# Patient Record
Sex: Male | Born: 1965 | Race: White | Hispanic: No | Marital: Married | Smoking: Never smoker
Health system: Southern US, Community
[De-identification: ages and names within clinical notes are randomized; demographics above are authoritative.]

## PROBLEM LIST (undated history)

## (undated) DIAGNOSIS — F32A Depression, unspecified: Secondary | ICD-10-CM

## (undated) DIAGNOSIS — IMO0001 Reserved for inherently not codable concepts without codable children: Secondary | ICD-10-CM

## (undated) DIAGNOSIS — K449 Diaphragmatic hernia without obstruction or gangrene: Secondary | ICD-10-CM

## (undated) DIAGNOSIS — K2970 Gastritis, unspecified, without bleeding: Secondary | ICD-10-CM

## (undated) DIAGNOSIS — M1990 Unspecified osteoarthritis, unspecified site: Secondary | ICD-10-CM

## (undated) DIAGNOSIS — M545 Low back pain: Secondary | ICD-10-CM

## (undated) DIAGNOSIS — Z872 Personal history of diseases of the skin and subcutaneous tissue: Secondary | ICD-10-CM

## (undated) DIAGNOSIS — F329 Major depressive disorder, single episode, unspecified: Secondary | ICD-10-CM

## (undated) DIAGNOSIS — G8929 Other chronic pain: Secondary | ICD-10-CM

## (undated) DIAGNOSIS — K219 Gastro-esophageal reflux disease without esophagitis: Secondary | ICD-10-CM

## (undated) HISTORY — DX: Other chronic pain: G89.29

## (undated) HISTORY — DX: Low back pain: M54.5

## (undated) HISTORY — DX: Gastritis, unspecified, without bleeding: K29.70

## (undated) HISTORY — DX: Unspecified osteoarthritis, unspecified site: M19.90

## (undated) HISTORY — DX: Diaphragmatic hernia without obstruction or gangrene: K44.9

## (undated) HISTORY — DX: Depression, unspecified: F32.A

## (undated) HISTORY — DX: Gastro-esophageal reflux disease without esophagitis: K21.9

## (undated) HISTORY — DX: Reserved for inherently not codable concepts without codable children: IMO0001

## (undated) HISTORY — DX: Personal history of diseases of the skin and subcutaneous tissue: Z87.2

## (undated) HISTORY — PX: OTHER SURGICAL HISTORY: SHX169

## (undated) HISTORY — DX: Major depressive disorder, single episode, unspecified: F32.9

---
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
## 1988-05-11 HISTORY — PX: OTHER SURGICAL HISTORY: SHX169

## 2006-05-11 DIAGNOSIS — M545 Low back pain, unspecified: Secondary | ICD-10-CM

## 2006-05-11 DIAGNOSIS — G8929 Other chronic pain: Secondary | ICD-10-CM

## 2006-05-11 HISTORY — DX: Low back pain, unspecified: M54.50

## 2006-05-11 HISTORY — DX: Other chronic pain: G89.29

## 2006-07-06 ENCOUNTER — Ambulatory Visit: Payer: Self-pay | Admitting: Specialist

## 2008-05-11 DIAGNOSIS — Z872 Personal history of diseases of the skin and subcutaneous tissue: Secondary | ICD-10-CM

## 2008-05-11 HISTORY — DX: Personal history of diseases of the skin and subcutaneous tissue: Z87.2

## 2009-08-28 LAB — HM COLONOSCOPY

## 2010-01-10 ENCOUNTER — Ambulatory Visit: Payer: Self-pay | Admitting: Gastroenterology

## 2010-01-14 LAB — PATHOLOGY REPORT

## 2011-07-17 ENCOUNTER — Encounter: Payer: Self-pay | Admitting: Internal Medicine

## 2011-08-27 ENCOUNTER — Ambulatory Visit (INDEPENDENT_AMBULATORY_CARE_PROVIDER_SITE_OTHER): Payer: BC Managed Care – PPO | Admitting: Internal Medicine

## 2011-08-27 ENCOUNTER — Encounter: Payer: Self-pay | Admitting: Internal Medicine

## 2011-08-27 VITALS — BP 116/72 | HR 52 | Temp 97.7°F | Resp 16 | Wt 234.2 lb

## 2011-08-27 DIAGNOSIS — K589 Irritable bowel syndrome without diarrhea: Secondary | ICD-10-CM

## 2011-08-27 DIAGNOSIS — R0609 Other forms of dyspnea: Secondary | ICD-10-CM

## 2011-08-27 DIAGNOSIS — Z0000 Encounter for general adult medical examination without abnormal findings: Secondary | ICD-10-CM | POA: Insufficient documentation

## 2011-08-27 DIAGNOSIS — R0989 Other specified symptoms and signs involving the circulatory and respiratory systems: Secondary | ICD-10-CM

## 2011-08-27 DIAGNOSIS — R0600 Dyspnea, unspecified: Secondary | ICD-10-CM

## 2011-08-27 NOTE — Patient Instructions (Signed)
Health Maintenance, Males A healthy lifestyle and preventative care can promote health and wellness.  Maintain regular health, dental, and eye exams.   Eat a healthy diet. Foods like vegetables, fruits, whole grains, low-fat dairy products, and lean protein foods contain the nutrients you need without too many calories. Decrease your intake of foods high in solid fats, added sugars, and salt. Get information about a proper diet from your caregiver, if necessary.   Regular physical exercise is one of the most important things you can do for your health. Most adults should get at least 150 minutes of moderate-intensity exercise (any activity that increases your heart rate and causes you to sweat) each week. In addition, most adults need muscle-strengthening exercises on 2 or more days a week.    Maintain a healthy weight. The body mass index (BMI) is a screening tool to identify possible weight problems. It provides an estimate of body fat based on height and weight. Your caregiver can help determine your BMI, and can help you achieve or maintain a healthy weight. For adults 20 years and older:   A BMI below 18.5 is considered underweight.   A BMI of 18.5 to 24.9 is normal.   A BMI of 25 to 29.9 is considered overweight.   A BMI of 30 and above is considered obese.   Maintain normal blood lipids and cholesterol by exercising and minimizing your intake of saturated fat. Eat a balanced diet with plenty of fruits and vegetables. Blood tests for lipids and cholesterol should begin at age 20 and be repeated every 5 years. If your lipid or cholesterol levels are high, you are over 50, or you are a high risk for heart disease, you may need your cholesterol levels checked more frequently.Ongoing high lipid and cholesterol levels should be treated with medicines, if diet and exercise are not effective.   If you smoke, find out from your caregiver how to quit. If you do not use tobacco, do not start.    If you choose to drink alcohol, do not exceed 2 drinks per day. One drink is considered to be 12 ounces (355 mL) of beer, 5 ounces (148 mL) of wine, or 1.5 ounces (44 mL) of liquor.   Avoid use of street drugs. Do not share needles with anyone. Ask for help if you need support or instructions about stopping the use of drugs.   High blood pressure causes heart disease and increases the risk of stroke. Blood pressure should be checked at least every 1 to 2 years. Ongoing high blood pressure should be treated with medicines if weight loss and exercise are not effective.   If you are 45 to 45 years old, ask your caregiver if you should take aspirin to prevent heart disease.   Diabetes screening involves taking a blood sample to check your fasting blood sugar level. This should be done once every 3 years, after age 45, if you are within normal weight and without risk factors for diabetes. Testing should be considered at a younger age or be carried out more frequently if you are overweight and have at least 1 risk factor for diabetes.   Colorectal cancer can be detected and often prevented. Most routine colorectal cancer screening begins at the age of 50 and continues through age 75. However, your caregiver may recommend screening at an earlier age if you have risk factors for colon cancer. On a yearly basis, your caregiver may provide home test kits to check for hidden   blood in the stool. Use of a small camera at the end of a tube, to directly examine the colon (sigmoidoscopy or colonoscopy), can detect the earliest forms of colorectal cancer. Talk to your caregiver about this at age 50, when routine screening begins. Direct examination of the colon should be repeated every 5 to 10 years through age 75, unless early forms of pre-cancerous polyps or small growths are found.   Hepatitis C blood testing is recommended for all people born from 1945 through 1965 and any individual with known risks for  hepatitis C.   Healthy men should no longer receive prostate-specific antigen (PSA) blood tests as part of routine cancer screening. Consult with your caregiver about prostate cancer screening.   Testicular cancer screening is not recommended for adolescents or adult males who have no symptoms. Screening includes self-exam, caregiver exam, and other screening tests. Consult with your caregiver about any symptoms you have or any concerns you have about testicular cancer.   Practice safe sex. Use condoms and avoid high-risk sexual practices to reduce the spread of sexually transmitted infections (STIs).   Use sunscreen with a sun protection factor (SPF) of 30 or greater. Apply sunscreen liberally and repeatedly throughout the day. You should seek shade when your shadow is shorter than you. Protect yourself by wearing long sleeves, pants, a wide-brimmed hat, and sunglasses year round, whenever you are outdoors.   Notify your caregiver of new moles or changes in moles, especially if there is a change in shape or color. Also notify your caregiver if a mole is larger than the size of a pencil eraser.   A one-time screening for abdominal aortic aneurysm (AAA) and surgical repair of large AAAs by sound wave imaging (ultrasonography) is recommended for ages 65 to 75 years who are current or former smokers.   Stay current with your immunizations.  Document Released: 10/24/2007 Document Revised: 04/16/2011 Document Reviewed: 09/22/2010 [employer] Patient Information 2012 [employer]. 

## 2011-08-27 NOTE — Assessment & Plan Note (Signed)
Exam done, labs ordered, pt ed material was given 

## 2011-08-27 NOTE — Progress Notes (Signed)
Subjective:    Patient ID: Manuel Bauer, male    DOB: August 19, 1965, 46 y.o.   MRN: [id number]  Shortness of Breath This is a chronic problem. The current episode started more than 1 year ago. The problem occurs intermittently. The problem has been unchanged. Associated symptoms include abdominal pain (chronic abd pain and bloating). Pertinent negatives include no chest pain, claudication, fever, headaches, hemoptysis, leg swelling, neck pain, orthopnea, PND, rash, rhinorrhea, sputum production, syncope, vomiting or wheezing. The symptoms are aggravated by exercise. Risk factors include no known risk factors.      Review of Systems  Constitutional: Negative for fever, chills, diaphoresis, activity change, appetite change, fatigue and unexpected weight change.  HENT: Negative.  Negative for rhinorrhea and neck pain.   Eyes: Negative.   Respiratory: Positive for shortness of breath. Negative for apnea, cough, hemoptysis, sputum production, choking, chest tightness, wheezing and stridor.   Cardiovascular: Negative for chest pain, orthopnea, claudication, leg swelling, syncope and PND.  Gastrointestinal: Positive for abdominal pain (chronic abd pain and bloating) and constipation. Negative for nausea, vomiting, diarrhea, blood in stool, abdominal distention, anal bleeding and rectal pain.  Genitourinary: Negative for dysuria, urgency, frequency, hematuria, flank pain, decreased urine volume, discharge, penile swelling, scrotal swelling, enuresis, difficulty urinating, genital sores, penile pain and testicular pain.  Musculoskeletal: Positive for back pain (chronic, unchanged LBP). Negative for myalgias, joint swelling, arthralgias and gait problem.  Skin: Negative for color change, pallor, rash and wound.  Neurological: Negative for dizziness, tremors, seizures, syncope, facial asymmetry, speech difficulty, weakness, light-headedness, numbness and headaches.  Hematological: Negative for adenopathy.  Does not bruise/bleed easily.  Psychiatric/Behavioral: Negative for suicidal ideas, hallucinations, behavioral problems, confusion, sleep disturbance, self-injury, dysphoric mood, decreased concentration and agitation. The patient is not nervous/anxious and is not hyperactive.        Objective:   Physical Exam  Vitals reviewed. Constitutional: He is oriented to person, place, and time. He appears well-developed and well-nourished. No distress.  HENT:  Head: Normocephalic and atraumatic.  Mouth/Throat: Oropharynx is clear and moist. No oropharyngeal exudate.  Eyes: Conjunctivae are normal. Right eye exhibits no discharge. Left eye exhibits no discharge. No scleral icterus.  Neck: Normal range of motion. Neck supple. No JVD present. No tracheal deviation present. No thyromegaly present.  Cardiovascular: Normal rate, regular rhythm, normal heart sounds and intact distal pulses.  Exam reveals no gallop and no friction rub.   No murmur heard. Pulmonary/Chest: Effort normal and breath sounds normal. No respiratory distress. He has no wheezes. He has no rales. He exhibits no tenderness.  Abdominal: Soft. Bowel sounds are normal. He exhibits no distension. There is no tenderness. There is no rebound and no guarding. Hernia confirmed negative in the right inguinal area and confirmed negative in the left inguinal area.  Genitourinary: Rectum normal, testes normal and penis normal. Rectal exam shows no external hemorrhoid, no internal hemorrhoid, no fissure, no mass, no tenderness and anal tone normal. Guaiac negative stool. Prostate is enlarged (1+ BPH, smooth and symmetrical). Prostate is not tender. Right testis shows no mass, no swelling and no tenderness. Right testis is descended. Cremasteric reflex is not absent on the right side. Left testis shows no mass, no swelling and no tenderness. Left testis is descended. Cremasteric reflex is not absent on the left side. Circumcised. No penile tenderness. No  discharge found.       He has solid formed stool in his rectal vault  Musculoskeletal: Normal range of motion. He exhibits no edema and  no tenderness.  Lymphadenopathy:    He has no cervical adenopathy.       Right: No inguinal adenopathy present.       Left: No inguinal adenopathy present.  Neurological: He is alert and oriented to person, place, and time. He has normal reflexes. He displays normal reflexes. No cranial nerve deficit. He exhibits normal muscle tone. Coordination normal.  Skin: Skin is warm and dry. No rash noted. He is not diaphoretic. No erythema. No pallor.  Psychiatric: His behavior is normal. Judgment and thought content normal. His mood appears anxious. His affect is not angry, not blunt, not labile and not inappropriate. His speech is not rapid and/or pressured, not delayed, not tangential and not slurred. He is not agitated, not aggressive, is not hyperactive, not slowed, not withdrawn, not actively hallucinating and not combative. Thought content is not paranoid and not delusional. Cognition and memory are not impaired. He does not express impulsivity or inappropriate judgment. He does not exhibit a depressed mood. He expresses no homicidal and no suicidal ideation. He expresses no suicidal plans and no homicidal plans. He is communicative. He exhibits normal recent memory and normal remote memory. He is attentive.          Assessment & Plan:

## 2011-08-27 NOTE — Assessment & Plan Note (Signed)
His EKG today is normal. I don't think his DOE is cardiac in nature but it is most likely aging and deconditioning

## 2011-08-30 ENCOUNTER — Encounter: Payer: Self-pay | Admitting: Internal Medicine

## 2011-08-30 DIAGNOSIS — K589 Irritable bowel syndrome without diarrhea: Secondary | ICD-10-CM | POA: Insufficient documentation

## 2011-08-30 NOTE — Assessment & Plan Note (Signed)
I await the results of his visit with GI, he was not receptive to any treatment options today such as Linzess

## 2011-08-31 ENCOUNTER — Ambulatory Visit (INDEPENDENT_AMBULATORY_CARE_PROVIDER_SITE_OTHER): Payer: BC Managed Care – PPO | Admitting: Internal Medicine

## 2011-08-31 ENCOUNTER — Encounter: Payer: Self-pay | Admitting: Internal Medicine

## 2011-08-31 DIAGNOSIS — K5900 Constipation, unspecified: Secondary | ICD-10-CM

## 2011-08-31 DIAGNOSIS — R1084 Generalized abdominal pain: Secondary | ICD-10-CM

## 2011-08-31 DIAGNOSIS — K589 Irritable bowel syndrome without diarrhea: Secondary | ICD-10-CM

## 2011-08-31 DIAGNOSIS — R142 Eructation: Secondary | ICD-10-CM

## 2011-08-31 DIAGNOSIS — R141 Gas pain: Secondary | ICD-10-CM

## 2011-08-31 DIAGNOSIS — R143 Flatulence: Secondary | ICD-10-CM

## 2011-08-31 MED ORDER — LINACLOTIDE 145 MCG PO CAPS: 1.0000 | ORAL_CAPSULE | Freq: Every day | ORAL | Status: DC

## 2011-08-31 NOTE — Patient Instructions (Addendum)
Your physician has requested that you go to the basement for the following lab work before leaving today: Celiac 10, IgA  We have given you samples of the following medication to take: Linzess 145 mcg- Take 1 capsule by mouth once daily.  If the Linzess 145 mcg 1 capsule daily makes your stool too loose, please decrease to 1 capsule every other day. If the Linzess 145 mcg 1 capsule daily does not help your constipation, please increase to 1 capsule twice daily.  We have given you an irritable bowel syndrome handout to read over.  Please call us with an update as to how your constipation is doing on Linzess. You may call phone # [phone] and ask to speak to [facility].

## 2011-08-31 NOTE — Progress Notes (Signed)
HISTORY OF PRESENT ILLNESS:  Manuel Bauer is a 46 y.o. male who presents today regarding chronic abdominal complaints of 4 years duration. He had been seen previously, on a number of occasions, and [name]. However, he was dissatisfied with his evaluation and requested to be seen in this office. His chief complaint is that of difficulties with bowel movements. Associated with this is abdominal bloating and abdominal discomfort. He states that he will not have a bowel movement, or an adequate bowel movement, unless he consumes a fiber supplement and certain coffee daily. When he does defecate, he describes stools to be somewhat loose and a large volume. He cannot go daily spontaneously. With his regimen, he can move his bowels daily. He states that defecation makes him feel great. He denies nausea, vomiting, weight loss, or bleeding. No nocturnal symptoms. Fatty foods seem to help. Activity seems to help. He cannot relate stress one way or the other. Review of outside records shows several office evaluations dating back to April 2010. The year previously was said to have a normal CT scan of the abdomen. Complete colonoscopy and upper endoscopy were performed September 2011. Colonoscopy was normal. Upper endoscopy was essentially normal except for minimal gastroduodenitis without H. Pyloric. He had been tried on MiraLax without success. He is frustrated, somewhat, without a known cause for symptoms.  REVIEW OF SYSTEMS:  All non-GI ROS negative except for back pain, visual change, night sweats, ankle swelling, shortness of breath  Past Medical History  Diagnosis Date  . Reflux   . Hiatal hernia   . Gastritis   . Arthritis   . Depression   . Chronic low back pain 2008  . H/O folliculitis 2010    Past Surgical History  Procedure Date  . Arm surgery     fracture repair - rt forearm  . Bilateral leg vein laser surgery   . Head injury 1970  . Right forearm compound fracture 1990    open  reduction and fixation    Social History Manuel Bauer  reports that he has never smoked. He has never used smokeless tobacco. He reports that he drinks about 1.8 ounces of alcohol per week. He reports that he does not use illicit drugs.  family history is not on file.  He is adopted.  No Known Allergies     PHYSICAL EXAMINATION: Vital signs: BP 130/88  Pulse 56  Ht 6\' 2"  (1.88 m)  Wt 231 lb 3.2 oz (104.872 kg)  BMI 29.68 kg/m2  SpO2 98%  Constitutional: generally well-appearing, no acute distress Psychiatric: alert and oriented x3, cooperative Eyes: extraocular movements intact, anicteric, conjunctiva pink Mouth: oral pharynx moist, no lesions Neck: supple no lymphadenopathy Cardiovascular: heart regular rate and rhythm, no murmur Lungs: clear to auscultation bilaterally Abdomen: soft, nontender, nondistended, no obvious ascites, no peritoneal signs, normal bowel sounds, no organomegaly Extremities: no lower extremity edema bilaterally Skin: no lesions on visible extremities Neuro: No focal deficits.   ASSESSMENT:  #1. Chronic bloating and difficulty with bowel movements most consistent with irritable bowel syndrome. #2. Unremarkable colonoscopy and upper endoscopy in 2011, in [place]   PLAN:  #1. Discussion of irritable bowel syndrome #2. Literature on irritable bowel syndrome provided #3. Linzess 145 mcg once daily. Samples given  #4. Patient will contact the office in a few weeks to report his response to medication. If this is helpful, he may continue. If not, consider other options and followup #5. Ongoing general medical care with Dr. Yetta Barre #6. Screening test  for celiac sprue today

## 2011-09-03 ENCOUNTER — Encounter: Payer: Self-pay | Admitting: Internal Medicine

## 2011-09-03 ENCOUNTER — Other Ambulatory Visit (INDEPENDENT_AMBULATORY_CARE_PROVIDER_SITE_OTHER): Payer: BC Managed Care – PPO

## 2011-09-03 DIAGNOSIS — Z0000 Encounter for general adult medical examination without abnormal findings: Secondary | ICD-10-CM

## 2011-09-03 DIAGNOSIS — K5900 Constipation, unspecified: Secondary | ICD-10-CM

## 2011-09-03 LAB — CBC WITH DIFFERENTIAL/PLATELET
Basophils Absolute: 0 10*3/uL (ref 0.0–0.1)
Basophils Relative: 0.8 % (ref 0.0–3.0)
Eosinophils Absolute: 0.2 10*3/uL (ref 0.0–0.7)
HCT: 44.5 % (ref 39.0–52.0)
Hemoglobin: 15.6 g/dL (ref 13.0–17.0)
Lymphocytes Relative: 38.8 % (ref 12.0–46.0)
Lymphs Abs: 1.7 10*3/uL (ref 0.7–4.0)
MCHC: 35.1 g/dL (ref 30.0–36.0)
Neutro Abs: 2 10*3/uL (ref 1.4–7.7)
RBC: 4.78 Mil/uL (ref 4.22–5.81)
RDW: 12.3 % (ref 11.5–14.6)

## 2011-09-03 LAB — COMPREHENSIVE METABOLIC PANEL
ALT: 45 U/L (ref 0–53)
AST: 29 U/L (ref 0–37)
Alkaline Phosphatase: 71 U/L (ref 39–117)
BUN: 13 mg/dL (ref 6–23)
Calcium: 9.4 mg/dL (ref 8.4–10.5)
Creatinine, Ser: 0.9 mg/dL (ref 0.4–1.5)
Total Bilirubin: 0.7 mg/dL (ref 0.3–1.2)

## 2011-09-04 LAB — CELIAC PANEL 10
Endomysial Screen: NEGATIVE
Gliadin IgA: 2.8 U/mL (ref <20)
Gliadin IgG: 4.2 U/mL (ref <20)
IgA: 129 mg/dL (ref 68–379)
Tissue Transglut Ab: 6.9 U/mL (ref <20)

## 2011-10-09 ENCOUNTER — Encounter: Payer: Self-pay | Admitting: Internal Medicine

## 2011-10-09 ENCOUNTER — Ambulatory Visit: Payer: BC Managed Care – PPO | Admitting: Internal Medicine

## 2011-10-09 ENCOUNTER — Ambulatory Visit (INDEPENDENT_AMBULATORY_CARE_PROVIDER_SITE_OTHER): Payer: BC Managed Care – PPO | Admitting: Internal Medicine

## 2011-10-09 ENCOUNTER — Ambulatory Visit (INDEPENDENT_AMBULATORY_CARE_PROVIDER_SITE_OTHER)
Admission: RE | Admit: 2011-10-09 | Discharge: 2011-10-09 | Disposition: A | Discharge status: Home / Self Care | Payer: BC Managed Care – PPO | Source: Ambulatory Visit | Attending: Internal Medicine | Admitting: Internal Medicine

## 2011-10-09 VITALS — BP 110/72 | HR 52 | Temp 98.3°F | Ht 74.0 in | Wt 233.8 lb

## 2011-10-09 DIAGNOSIS — L739 Follicular disorder, unspecified: Secondary | ICD-10-CM

## 2011-10-09 DIAGNOSIS — M79645 Pain in left finger(s): Secondary | ICD-10-CM

## 2011-10-09 DIAGNOSIS — Y9366 Activity, soccer: Secondary | ICD-10-CM

## 2011-10-09 DIAGNOSIS — M79609 Pain in unspecified limb: Secondary | ICD-10-CM

## 2011-10-09 DIAGNOSIS — K589 Irritable bowel syndrome without diarrhea: Secondary | ICD-10-CM

## 2011-10-09 DIAGNOSIS — L738 Other specified follicular disorders: Secondary | ICD-10-CM

## 2011-10-09 DIAGNOSIS — L678 Other hair color and hair shaft abnormalities: Secondary | ICD-10-CM

## 2011-10-09 MED ORDER — ERYTHROMYCIN 2 % EX SOLN: Freq: Every day | CUTANEOUS | Status: DC

## 2011-10-09 MED ORDER — LINACLOTIDE 145 MCG PO CAPS: 1.0000 | ORAL_CAPSULE | Freq: Every day | ORAL | Status: DC

## 2011-10-09 NOTE — Patient Instructions (Signed)
It was good to see you today. Test(s) ordered today. Your results will be called to you after review (48-72hours after test completion). If any changes need to be made, you will be notified at that time. Medications reviewed, no changes at this time.Refill on medication(s) as discussed today.  

## 2011-10-09 NOTE — Progress Notes (Signed)
  Subjective:    Patient ID: Manuel Bauer, male    DOB: [birth year], 46 y.o.   MRN: [id number]  HPI  complains of pain in L thumb Onset 6 days ago Precipitated by accidental injury while playing soccer Pain over thumb base/PIP, esp with flexion associated with swelling and bruising which have improved over past 6 days  also requests refills of meds for IBS and folliculitis  Past Medical History  Diagnosis Date  . Reflux   . Hiatal hernia   . Gastritis   . Arthritis   . Depression   . Chronic low back pain 2008  . H/O folliculitis 2010    Review of Systems  Musculoskeletal: Positive for joint swelling.  Skin: Positive for color change.  Neurological: Negative for weakness.       Objective:   Physical Exam  BP 110/72  Pulse 52  Temp(Src) 98.3 F (36.8 C) (Oral)  Ht 6\' 2"  (1.88 m)  Wt 233 lb 12.8 oz (106.051 kg)  BMI 30.02 kg/m2  SpO2 99% Wt Readings from Last 3 Encounters:  10/09/11 233 lb 12.8 oz (106.051 kg)  08/31/11 231 lb 3.2 oz (104.872 kg)  08/27/11 234 lb 4 oz (106.255 kg)   Constitutional:  He appears well-developed and well-nourished. No distress. Cardiovascular: Normal rate, regular rhythm and normal heart sounds.  No murmur heard. no BLE edema Pulmonary/Chest: Effort normal and breath sounds normal. No respiratory distress. no wheezes.  Musculoskeletal: L PIP swelling and pain with flexion - otherwise normal range of motion. Patient exhibits slight edema of L palm.  Psychiatric: he has a normal mood and affect. behavior is normal. Judgment and thought content normal.       Assessment & Plan:  L thumb pain/swelling - ?fx vs sprain - check xray  BS-C - improved with Liness - erx refill done  Folliculitis - hx same - refill antibiotics solution as requested

## 2011-10-20 ENCOUNTER — Encounter (HOSPITAL_COMMUNITY): Payer: Self-pay | Admitting: Emergency Medicine

## 2011-10-20 ENCOUNTER — Emergency Department (HOSPITAL_COMMUNITY)
Admission: EM | Admit: 2011-10-20 | Discharge: 2011-10-20 | Disposition: A | Discharge status: Home / Self Care | Payer: BC Managed Care – PPO | Attending: Emergency Medicine | Admitting: Emergency Medicine

## 2011-10-20 DIAGNOSIS — G8929 Other chronic pain: Secondary | ICD-10-CM | POA: Insufficient documentation

## 2011-10-20 DIAGNOSIS — K219 Gastro-esophageal reflux disease without esophagitis: Secondary | ICD-10-CM | POA: Insufficient documentation

## 2011-10-20 DIAGNOSIS — K5289 Other specified noninfective gastroenteritis and colitis: Secondary | ICD-10-CM | POA: Insufficient documentation

## 2011-10-20 DIAGNOSIS — K529 Noninfective gastroenteritis and colitis, unspecified: Secondary | ICD-10-CM

## 2011-10-20 DIAGNOSIS — R42 Dizziness and giddiness: Secondary | ICD-10-CM | POA: Insufficient documentation

## 2011-10-20 DIAGNOSIS — Z8739 Personal history of other diseases of the musculoskeletal system and connective tissue: Secondary | ICD-10-CM | POA: Insufficient documentation

## 2011-10-20 LAB — DIFFERENTIAL
Basophils Absolute: 0 K/uL (ref 0.0–0.1)
Basophils Relative: 0 % (ref 0–1)
Eosinophils Absolute: 0 K/uL (ref 0.0–0.7)
Eosinophils Relative: 1 % (ref 0–5)
Lymphocytes Relative: 19 % (ref 12–46)
Lymphs Abs: 1.4 K/uL (ref 0.7–4.0)
Monocytes Absolute: 0.4 K/uL (ref 0.1–1.0)
Monocytes Relative: 5 % (ref 3–12)
Neutro Abs: 5.4 K/uL (ref 1.7–7.7)
Neutrophils Relative %: 75 % (ref 43–77)

## 2011-10-20 LAB — BASIC METABOLIC PANEL
BUN: 15 mg/dL (ref 6–23)
Calcium: 9 mg/dL (ref 8.4–10.5)
Chloride: 104 mEq/L (ref 96–112)
Creatinine, Ser: 0.89 mg/dL (ref 0.50–1.35)
GFR calc Af Amer: >90 mL/min (ref >90)

## 2011-10-20 LAB — CBC
HCT: 40.5 % (ref 39.0–52.0)
Hemoglobin: 14.7 g/dL (ref 13.0–17.0)
MCH: 32 pg (ref 26.0–34.0)
MCHC: 36.3 g/dL — ABNORMAL HIGH (ref 30.0–36.0)
MCV: 88 fL (ref 78.0–100.0)
RDW: 11.7 % (ref 11.5–15.5)

## 2011-10-20 MED ORDER — LORAZEPAM 1 MG PO TABS: 1.0000 mg | ORAL_TABLET | Freq: Three times a day (TID) | ORAL | Status: AC | PRN

## 2011-10-20 MED ORDER — PANTOPRAZOLE SODIUM 40 MG IV SOLR
40.0000 mg | Freq: Once | INTRAVENOUS | Status: AC
  Administered 2011-10-20: 40 mg via INTRAVENOUS
  Filled 2011-10-20: qty 40

## 2011-10-20 MED ORDER — MECLIZINE HCL 25 MG PO TABS: 25.0000 mg | ORAL_TABLET | Freq: Four times a day (QID) | ORAL | Status: AC

## 2011-10-20 MED ORDER — ONDANSETRON HCL 4 MG/2ML IJ SOLN
4.0000 mg | Freq: Once | INTRAMUSCULAR | Status: AC
  Administered 2011-10-20: 4 mg via INTRAVENOUS
  Filled 2011-10-20: qty 2

## 2011-10-20 MED ORDER — ONDANSETRON HCL 8 MG PO TABS: 8.0000 mg | ORAL_TABLET | ORAL | Status: AC | PRN

## 2011-10-20 MED ORDER — ONDANSETRON 4 MG PO TBDP
8.0000 mg | ORAL_TABLET | Freq: Once | ORAL | Status: AC
  Administered 2011-10-20: 8 mg via ORAL
  Filled 2011-10-20: qty 2

## 2011-10-20 MED ORDER — SODIUM CHLORIDE 0.9 % IV BOLUS (SEPSIS)
1000.0000 mL | Freq: Once | INTRAVENOUS | Status: AC
  Administered 2011-10-20: 1000 mL via INTRAVENOUS

## 2011-10-20 NOTE — ED Notes (Signed)
Dr Adriana Simas notified of pt condition.

## 2011-10-20 NOTE — ED Notes (Signed)
Pt sts woke up this am and took a new IBS med Linzess and then laid back down got up around 0700 and was feeling dizzy upon standing or turning head; pt sts ate breakfast and then became nauseated and vomited; pt sts two episodes of watery diarrhea this am which is not uncommon and sts still feels dizzy with trouble walking; pt with no obvious neuro deficits; grip strength equal; pt sts feels leathargic

## 2011-10-20 NOTE — ED Notes (Signed)
Pt reports weakness with ambulation to restroom.

## 2011-10-20 NOTE — ED Notes (Signed)
Pt became sick with vomiting and dizziness upon preparing to go home.  Pt has vomited multiple times since sitting up.  Dr. Glendell Docker notified.  Orders received.  Pt transferred to CDU[facility]

## 2011-10-20 NOTE — ED Notes (Signed)
Pt states he "might be dehydrated due to new medication for IBS that is giving me watery diarrhea"

## 2011-10-20 NOTE — Discharge Instructions (Signed)
Increase fluids.  Followup your primary care Dr.    medications for nausea, dizziness, restlessness.  Try to get in a comfortable position.   Return if getting worse

## 2011-10-20 NOTE — ED Notes (Signed)
Pt up to restroom.

## 2011-10-20 NOTE — ED Provider Notes (Addendum)
History     CSN: [id number]  Arrival date & time 10/20/11  1013   First MD Initiated Contact with Patient 10/20/11 1039      Chief Complaint  Patient presents with  . Dizziness  . Nausea    (Consider location/radiation/quality/duration/timing/severity/associated sxs/prior treatment) HPI .... dizziness, nausea, copious diarrhea this morning. felt lightheaded and dizzy after event.  Patient has been on Linzess for approximately one month for airway bowel syndrome. Nothing makes symptoms better or worse. No abdominal pain, fever, chest pain, shortness of breath, neurological deficits. Severity is mild.  Past Medical History  Diagnosis Date  . Reflux   . Hiatal hernia   . Gastritis   . Arthritis   . Depression   . Chronic low back pain 2008  . H/O folliculitis 2010    Past Surgical History  Procedure Date  . Arm surgery     fracture repair - rt forearm  . Bilateral leg vein laser surgery   . Head injury 1970  . Right forearm compound fracture 1990    open reduction and fixation    Family History  Problem Relation Age of Onset  . Adopted: Yes    History  Substance Use Topics  . Smoking status: Never Smoker   . Smokeless tobacco: Never Used  . Alcohol Use: 1.8 oz/week    3 Cans of beer per week     1-3 drinks per month      Review of Systems  All other systems reviewed and are negative.    Allergies  Review of patient's allergies indicates no known allergies.  Home Medications   Current Outpatient Rx  Name Route Sig Dispense Refill  . LINACLOTIDE 145 MCG PO CAPS Oral Take 1 capsule by mouth every other day.      BP 134/81  Pulse 46  Temp 97.4 F (36.3 C)  Resp 12  SpO2 96%  Physical Exam  Nursing note and vitals reviewed. Constitutional: He is oriented to person, place, and time. He appears well-developed and well-nourished.  HENT:  Head: Normocephalic and atraumatic.  Eyes: Conjunctivae and EOM are normal. Pupils are equal, round, and  reactive to light.  Neck: Normal range of motion. Neck supple.  Cardiovascular: Normal rate and regular rhythm.   Pulmonary/Chest: Effort normal and breath sounds normal.  Abdominal: Soft. Bowel sounds are normal.  Musculoskeletal: Normal range of motion.  Neurological: He is alert and oriented to person, place, and time.  Skin: Skin is warm and dry.  Psychiatric: He has a normal mood and affect.    ED Course  Procedures (including critical care time)  Labs Reviewed  CBC - Abnormal; Notable for the following:    MCHC 36.3 (*)    All other components within normal limits  BASIC METABOLIC PANEL - Abnormal; Notable for the following:    Glucose, Bld 112 (*)    All other components within normal limits  DIFFERENTIAL   No results found.   1. Gastroenteritis       MDM  I suspect patient got volume depleted after his bout of diarrhea. His pulse is normally low. Will hydrate with 2 L of fluids and check electrolytes.  Recheck at 1400: Doing much better.   Discussed test results with patient and his wife   1500: Patient had episode of emesis and lightheadedness.  No neuro deficits. By mouth Zofran given. Patient and wife desire to go home. Admission offered.   Donnetta Hutching, MD 10/20/11 1421  Donnetta Hutching, MD  10/20/11 1531 

## 2011-10-22 ENCOUNTER — Telehealth: Payer: Self-pay | Admitting: Internal Medicine

## 2011-10-22 NOTE — Telephone Encounter (Signed)
Pt was seen in the ER earlier this week for vertigo and vomiting. States he took linzess 2 days in a row and was told he was dehydrated. Was given 2 liters of fluid in the ER. Pt wanted to know if he needed to follow-up in the office. Discussed with pt that he could call us if he developed problems again if he needed to be seen. Pt verbalized understanding.

## 2012-10-13 ENCOUNTER — Ambulatory Visit (INDEPENDENT_AMBULATORY_CARE_PROVIDER_SITE_OTHER)
Admission: RE | Admit: 2012-10-13 | Discharge: 2012-10-13 | Disposition: A | Discharge status: Home / Self Care | Payer: BC Managed Care – PPO | Source: Ambulatory Visit | Attending: Internal Medicine | Admitting: Internal Medicine

## 2012-10-13 ENCOUNTER — Ambulatory Visit (INDEPENDENT_AMBULATORY_CARE_PROVIDER_SITE_OTHER): Payer: BC Managed Care – PPO | Admitting: Internal Medicine

## 2012-10-13 ENCOUNTER — Encounter: Payer: Self-pay | Admitting: Internal Medicine

## 2012-10-13 ENCOUNTER — Other Ambulatory Visit (INDEPENDENT_AMBULATORY_CARE_PROVIDER_SITE_OTHER): Payer: BC Managed Care – PPO

## 2012-10-13 VITALS — BP 126/88 | HR 54 | Temp 97.8°F | Resp 16 | Ht 76.0 in | Wt 229.2 lb

## 2012-10-13 DIAGNOSIS — Z0000 Encounter for general adult medical examination without abnormal findings: Secondary | ICD-10-CM

## 2012-10-13 DIAGNOSIS — S99922A Unspecified injury of left foot, initial encounter: Secondary | ICD-10-CM

## 2012-10-13 DIAGNOSIS — S8990XA Unspecified injury of unspecified lower leg, initial encounter: Secondary | ICD-10-CM

## 2012-10-13 DIAGNOSIS — S99929A Unspecified injury of unspecified foot, initial encounter: Secondary | ICD-10-CM

## 2012-10-13 DIAGNOSIS — Z136 Encounter for screening for cardiovascular disorders: Secondary | ICD-10-CM

## 2012-10-13 DIAGNOSIS — S99919A Unspecified injury of unspecified ankle, initial encounter: Secondary | ICD-10-CM

## 2012-10-13 LAB — COMPREHENSIVE METABOLIC PANEL
BUN: 10 mg/dL (ref 6–23)
CO2: 28 mEq/L (ref 19–32)
Calcium: 9.4 mg/dL (ref 8.4–10.5)
Chloride: 105 mEq/L (ref 96–112)
Creatinine, Ser: 0.9 mg/dL (ref 0.4–1.5)
GFR: 95.56 mL/min (ref >60.00)
Glucose, Bld: 91 mg/dL (ref 70–99)
Total Bilirubin: 0.8 mg/dL (ref 0.3–1.2)

## 2012-10-13 LAB — URINALYSIS, ROUTINE W REFLEX MICROSCOPIC
Bilirubin Urine: NEGATIVE
Hgb urine dipstick: NEGATIVE
Ketones, ur: NEGATIVE
Leukocytes, UA: NEGATIVE
pH: 7 (ref 5.0–8.0)

## 2012-10-13 LAB — CBC WITH DIFFERENTIAL/PLATELET
Basophils Absolute: 0 10*3/uL (ref 0.0–0.1)
Basophils Relative: 0.9 % (ref 0.0–3.0)
Eosinophils Relative: 3.9 % (ref 0.0–5.0)
Lymphs Abs: 2.1 10*3/uL (ref 0.7–4.0)
MCHC: 34.7 g/dL (ref 30.0–36.0)
MCV: 93.9 fl (ref 78.0–100.0)
Monocytes Relative: 10.8 % (ref 3.0–12.0)
Neutro Abs: 1.9 10*3/uL (ref 1.4–7.7)
Neutrophils Relative %: 40 % — ABNORMAL LOW (ref 43.0–77.0)
Platelets: 192 10*3/uL (ref 150.0–400.0)
WBC: 4.8 10*3/uL (ref 4.5–10.5)

## 2012-10-13 LAB — LIPID PANEL
Cholesterol: 169 mg/dL (ref 0–200)
HDL: 32.5 mg/dL — ABNORMAL LOW (ref >39.00)
LDL Cholesterol: 105 mg/dL — ABNORMAL HIGH (ref 0–99)
VLDL: 31.8 mg/dL (ref 0.0–40.0)

## 2012-10-13 LAB — TSH: TSH: 1.63 u[IU]/mL (ref 0.35–5.50)

## 2012-10-13 NOTE — Assessment & Plan Note (Signed)
Exam done Vaccines were reviewed Labs ordered EKG is normal (sinus bradycardia) Pt ed material was given

## 2012-10-13 NOTE — Patient Instructions (Signed)
Foot Sprain °The muscles and cord like structures which attach muscle to bone (tendons) that surround the feet are made up of units. A foot sprain can occur at the weakest spot in any of these units. This condition is most often caused by injury to or overuse of the foot, as from playing contact sports, or aggravating a previous injury, or from poor conditioning, or obesity. °SYMPTOMS °· Pain with movement of the foot. °· Tenderness and swelling at the injury site. °· Loss of strength is present in moderate or severe sprains. °THE THREE GRADES OR SEVERITY OF FOOT SPRAIN ARE: °· Mild (Grade I): Slightly pulled muscle without tearing of muscle or tendon fibers or loss of strength. °· Moderate (Grade II): Tearing of fibers in a muscle, tendon, or at the attachment to bone, with small decrease in strength. °· Severe (Grade III): Rupture of the muscle-tendon-bone attachment, with separation of fibers. Severe sprain requires surgical repair. Often repeating (chronic) sprains are caused by overuse. Sudden (acute) sprains are caused by direct injury or over-use. °DIAGNOSIS  °Diagnosis of this condition is usually by your own observation. If problems continue, a caregiver may be required for further evaluation and treatment. X-rays may be required to make sure there are not breaks in the bones (fractures) present. Continued problems may require physical therapy for treatment. °PREVENTION °· Use strength and conditioning exercises appropriate for your sport. °· Warm up properly prior to working out. °· Use athletic shoes that are made for the sport you are participating in. °· Allow adequate time for healing. Early return to activities makes repeat injury more likely, and can lead to an unstable arthritic foot that can result in prolonged disability. Mild sprains generally heal in 3 to 10 days, with moderate and severe sprains taking 2 to 10 weeks. Your caregiver can help you determine the proper time required for  healing. °HOME CARE INSTRUCTIONS  °· Apply ice to the injury for 15-20 minutes, 3-4 times per day. Put the ice in a plastic bag and place a towel between the bag of ice and your skin. °· An elastic wrap (like an Ace bandage) may be used to keep swelling down. °· Keep foot above the level of the heart, or at least raised on a footstool, when swelling and pain are present. °· Try to avoid use other than gentle range of motion while the foot is painful. Do not resume use until instructed by your caregiver. Then begin use gradually, not increasing use to the point of pain. If pain does develop, decrease use and continue the above measures, gradually increasing activities that do not cause discomfort, until you gradually achieve normal use. °· Use crutches if and as instructed, and for the length of time instructed. °· Keep injured foot and ankle wrapped between treatments. °· Massage foot and ankle for comfort and to keep swelling down. Massage from the toes up towards the knee. °· Only take over-the-counter or prescription medicines for pain, discomfort, or fever as directed by your caregiver. °SEEK IMMEDIATE MEDICAL CARE IF:  °· Your pain and swelling increase, or pain is not controlled with medications. °· You have loss of feeling in your foot or your foot turns cold or blue. °· You develop new, unexplained symptoms, or an increase of the symptoms that brought you to your caregiver. °MAKE SURE YOU:  °· Understand these instructions. °· Will watch your condition. °· Will get help right away if you are not doing well or get worse. °Document Released:   10/17/2001 Document Revised: 07/20/2011 Document Reviewed: 12/15/2007 [facility] Patient Information 2014 [place], [place]. Health Maintenance, Males A healthy lifestyle and preventative care can promote health and wellness.  Maintain regular health, dental, and eye exams.  Eat a healthy diet. Foods like vegetables, fruits, whole grains, low-fat dairy products, and  lean protein foods contain the nutrients you need without too many calories. Decrease your intake of foods high in solid fats, added sugars, and salt. Get information about a proper diet from your caregiver, if necessary.  Regular physical exercise is one of the most important things you can do for your health. Most adults should get at least 150 minutes of moderate-intensity exercise (any activity that increases your heart rate and causes you to sweat) each week. In addition, most adults need muscle-strengthening exercises on 2 or more days a week.   Maintain a healthy weight. The body mass index (BMI) is a screening tool to identify possible weight problems. It provides an estimate of body fat based on height and weight. Your caregiver can help determine your BMI, and can help you achieve or maintain a healthy weight. For adults 20 years and older:  A BMI below 18.5 is considered underweight.  A BMI of 18.5 to 24.9 is normal.  A BMI of 25 to 29.9 is considered overweight.  A BMI of 30 and above is considered obese.  Maintain normal blood lipids and cholesterol by exercising and minimizing your intake of saturated fat. Eat a balanced diet with plenty of fruits and vegetables. Blood tests for lipids and cholesterol should begin at age 1 and be repeated every 5 years. If your lipid or cholesterol levels are high, you are over 50, or you are a high risk for heart disease, you may need your cholesterol levels checked more frequently.Ongoing high lipid and cholesterol levels should be treated with medicines, if diet and exercise are not effective.  If you smoke, find out from your caregiver how to quit. If you do not use tobacco, do not start.  If you choose to drink alcohol, do not exceed 2 drinks per day. One drink is considered to be 12 ounces (355 mL) of beer, 5 ounces (148 mL) of wine, or 1.5 ounces (44 mL) of liquor.  Avoid use of street drugs. Do not share needles with anyone. Ask for help  if you need support or instructions about stopping the use of drugs.  High blood pressure causes heart disease and increases the risk of stroke. Blood pressure should be checked at least every 1 to 2 years. Ongoing high blood pressure should be treated with medicines if weight loss and exercise are not effective.  If you are 7 to 46 years old, ask your caregiver if you should take aspirin to prevent heart disease.  Diabetes screening involves taking a blood sample to check your fasting blood sugar level. This should be done once every 3 years, after age 40, if you are within normal weight and without risk factors for diabetes. Testing should be considered at a younger age or be carried out more frequently if you are overweight and have at least 1 risk factor for diabetes.  Colorectal cancer can be detected and often prevented. Most routine colorectal cancer screening begins at the age of 71 and continues through age 47. However, your caregiver may recommend screening at an earlier age if you have risk factors for colon cancer. On a yearly basis, your caregiver may provide home test kits to check for hidden blood  in the stool. Use of a small camera at the end of a tube, to directly examine the colon (sigmoidoscopy or colonoscopy), can detect the earliest forms of colorectal cancer. Talk to your caregiver about this at age 80, when routine screening begins. Direct examination of the colon should be repeated every 5 to 10 years through age 68, unless early forms of pre-cancerous polyps or small growths are found.  Hepatitis C blood testing is recommended for all people born from [birth year] through 1964 and any individual with known risks for hepatitis C.  Healthy men should no longer receive prostate-specific antigen (PSA) blood tests as part of routine cancer screening. Consult with your caregiver about prostate cancer screening.  Testicular cancer screening is not recommended for adolescents or adult males  who have no symptoms. Screening includes self-exam, caregiver exam, and other screening tests. Consult with your caregiver about any symptoms you have or any concerns you have about testicular cancer.  Practice safe sex. Use condoms and avoid high-risk sexual practices to reduce the spread of sexually transmitted infections (STIs).  Use sunscreen with a sun protection factor (SPF) of 30 or greater. Apply sunscreen liberally and repeatedly throughout the day. You should seek shade when your shadow is shorter than you. Protect yourself by wearing long sleeves, pants, a wide-brimmed hat, and sunglasses year round, whenever you are outdoors.  Notify your caregiver of new moles or changes in moles, especially if there is a change in shape or color. Also notify your caregiver if a mole is larger than the size of a pencil eraser.  A one-time screening for abdominal aortic aneurysm (AAA) and surgical repair of large AAAs by sound wave imaging (ultrasonography) is recommended for ages 30 to 25 years who are current or former smokers.  Stay current with your immunizations. Document Released: 10/24/2007 Document Revised: 07/20/2011 Document Reviewed: 09/22/2010 [facility] Patient Information 2014 [place], [place].

## 2012-10-13 NOTE — Assessment & Plan Note (Signed)
Plain film is normal Will treat for a sprain and contusion

## 2012-10-13 NOTE — Progress Notes (Signed)
Subjective:    Patient ID: Manuel Bauer, male    DOB: [birth year], 47 y.o.   MRN: [id number]  Foot Injury  Incident onset: 4 days ago. [place] The injury mechanism was a direct blow. The pain is present in the left foot. The quality of the pain is described as aching. The pain is at a severity of 2/10. The pain is mild. The pain has been improving since onset. Pertinent negatives include no inability to bear weight, loss of motion, loss of sensation, muscle weakness, numbness or tingling. He has tried acetaminophen and NSAIDs for the symptoms. The treatment provided significant relief.      Review of Systems  Constitutional: Negative.  Negative for fever, chills, diaphoresis, activity change, appetite change, fatigue and unexpected weight change.  HENT: Negative.   Eyes: Negative.   Respiratory: Negative.  Negative for cough, chest tightness, shortness of breath, wheezing and stridor.   Cardiovascular: Negative.  Negative for chest pain, palpitations and leg swelling.  Gastrointestinal: Negative.   Endocrine: Negative.   Genitourinary: Negative.   Musculoskeletal: Positive for arthralgias (knees). Negative for myalgias, back pain, joint swelling and gait problem.  Skin: Negative.   Allergic/Immunologic: Negative.   Neurological: Negative.  Negative for tingling and numbness.  Hematological: Negative.  Negative for adenopathy. Does not bruise/bleed easily.  Psychiatric/Behavioral: Negative.        Objective:   Physical Exam  Vitals reviewed. Constitutional: He is oriented to person, place, and time. He appears well-developed and well-nourished. No distress.  HENT:  Head: Normocephalic and atraumatic.  Mouth/Throat: Oropharynx is clear and moist. No oropharyngeal exudate.  Eyes: Conjunctivae are normal. Right eye exhibits no discharge. Left eye exhibits no discharge. No scleral icterus.  Neck: Normal range of motion. Neck supple. No JVD present. No  tracheal deviation present. No thyromegaly present.  Cardiovascular: Normal rate, regular rhythm, normal heart sounds and intact distal pulses.  Exam reveals no gallop and no friction rub.   No murmur heard. Pulmonary/Chest: Effort normal and breath sounds normal. No stridor. No respiratory distress. He has no wheezes. He has no rales. He exhibits no tenderness.  Abdominal: Soft. Bowel sounds are normal. He exhibits no distension and no mass. There is no tenderness. There is no rebound and no guarding. Hernia confirmed negative in the right inguinal area and confirmed negative in the left inguinal area.  Genitourinary: Testes normal and penis normal. Right testis shows no mass, no swelling and no tenderness. Right testis is descended. Left testis shows no mass, no swelling and no tenderness. Left testis is descended. Circumcised. No penile erythema or penile tenderness. No discharge found.  Musculoskeletal: Normal range of motion. He exhibits no edema and no tenderness.       Feet:  Lymphadenopathy:    He has no cervical adenopathy.       Right: No inguinal adenopathy present.       Left: No inguinal adenopathy present.  Neurological: He is oriented to person, place, and time.  Skin: Skin is warm and dry. No rash noted. He is not diaphoretic. No erythema. No pallor.  Psychiatric: He has a normal mood and affect. His behavior is normal. Judgment and thought content normal.     Lab Results  Component Value Date   WBC 7.2 10/20/2011   HGB 14.7 10/20/2011   HCT 40.5 10/20/2011   PLT 179 10/20/2011   GLUCOSE 112* 10/20/2011   ALT 45 09/03/2011   AST 29 09/03/2011   NA  137 10/20/2011   K 4.8 10/20/2011   CL 104 10/20/2011   CREATININE 0.89 10/20/2011   BUN 15 10/20/2011   CO2 28 10/20/2011   TSH 1.13 09/03/2011   PSA 1.13 09/03/2011       Assessment & Plan:

## 2012-10-14 ENCOUNTER — Encounter: Payer: Self-pay | Admitting: Internal Medicine

## 2012-11-15 ENCOUNTER — Ambulatory Visit: Payer: BC Managed Care – PPO | Admitting: Internal Medicine

## 2013-03-16 ENCOUNTER — Other Ambulatory Visit: Payer: Self-pay

## 2013-04-12 ENCOUNTER — Ambulatory Visit (INDEPENDENT_AMBULATORY_CARE_PROVIDER_SITE_OTHER): Payer: BC Managed Care – PPO | Admitting: Internal Medicine

## 2013-04-12 ENCOUNTER — Encounter: Payer: Self-pay | Admitting: Internal Medicine

## 2013-04-12 VITALS — BP 130/88 | HR 80 | Temp 97.5°F | Resp 16 | Ht 74.0 in | Wt 232.0 lb

## 2013-04-12 DIAGNOSIS — M2669 Other specified disorders of temporomandibular joint: Secondary | ICD-10-CM

## 2013-04-12 DIAGNOSIS — J329 Chronic sinusitis, unspecified: Secondary | ICD-10-CM

## 2013-04-12 DIAGNOSIS — B9689 Other specified bacterial agents as the cause of diseases classified elsewhere: Secondary | ICD-10-CM

## 2013-04-12 DIAGNOSIS — M26629 Arthralgia of temporomandibular joint, unspecified side: Secondary | ICD-10-CM

## 2013-04-12 DIAGNOSIS — A499 Bacterial infection, unspecified: Secondary | ICD-10-CM

## 2013-04-12 MED ORDER — IBUPROFEN 600 MG PO TABS: 600.0000 mg | ORAL_TABLET | Freq: Three times a day (TID) | ORAL | Status: DC | PRN

## 2013-04-12 MED ORDER — AMOXICILLIN-POT CLAVULANATE 875-125 MG PO TABS: 1.0000 | ORAL_TABLET | Freq: Two times a day (BID) | ORAL | Status: DC

## 2013-04-12 NOTE — Patient Instructions (Signed)
Temporomandibular Problems  Temporomandibular joint (TMJ) dysfunction means there are problems with the joint between your jaw and your skull. This is a joint lined by cartilage like other joints in your body but also has a small disc in the joint which keeps the bones from rubbing on each other. These joints are like other joints and can get inflamed (sore) from arthritis and other problems. When this joint gets sore, it can cause headaches and pain in the jaw and the face. CAUSES  Usually the arthritic types of problems are caused by soreness in the joint. Soreness in the joint can also be caused by overuse. This may come from grinding your teeth. It may also come from mis-alignment in the joint. DIAGNOSIS Diagnosis of this condition can often be made by history and exam. Sometimes your caregiver may need X-rays or an MRI scan to determine the exact cause. It may be necessary to see your dentist to determine if your teeth and jaws are lined up correctly. TREATMENT  Most of the time this problem is not serious; however, sometimes it can persist (become chronic). When this happens medications that will cut down on inflammation (soreness) help. Sometimes a shot of cortisone into the joint will be helpful. If your teeth are not aligned it may help for your dentist to make a splint for your mouth that can help this problem. If no physical problems can be found, the problem may come from tension. If tension is found to be the cause, biofeedback or relaxation techniques may be helpful. HOME CARE INSTRUCTIONS   Later in the day, applications of ice packs may be helpful. Ice can be used in a plastic bag with a towel around it to prevent frostbite to skin. This may be used about every 2 hours for 20 to 30 minutes, as needed while awake, or as directed by your caregiver.  Only take over-the-counter or prescription medicines for pain, discomfort, or fever as directed by your caregiver.  If physical therapy was  prescribed, follow your caregiver's directions.  Wear mouth appliances as directed if they were given. Document Released: 01/20/2001 Document Revised: 07/20/2011 Document Reviewed: 04/29/2008 [facility] Patient Information 2014 [place], [place].

## 2013-04-12 NOTE — Progress Notes (Signed)
Pre visit review using our clinic review tool, if applicable. No additional management support is needed unless otherwise documented below in the visit note. °

## 2013-04-13 ENCOUNTER — Encounter: Payer: Self-pay | Admitting: Internal Medicine

## 2013-04-13 NOTE — Assessment & Plan Note (Signed)
He will see his dentist about this within the next month For now, I have advised him to take motrin for the pain

## 2013-04-13 NOTE — Assessment & Plan Note (Signed)
I will treat the infection with augmentin

## 2013-04-13 NOTE — Progress Notes (Signed)
Subjective:    Patient ID: Manuel Bauer, male    DOB: [birth year], 47 y.o.   MRN: [id number]  Sinusitis This is a new problem. The current episode started 1 to 4 weeks ago. The problem has been gradually worsening since onset. There has been no fever. Associated symptoms include sinus pressure and a sore throat. Pertinent negatives include no chills, congestion, coughing, diaphoresis, ear pain, headaches, hoarse voice, neck pain, shortness of breath, sneezing or swollen glands. Past treatments include oral decongestants. The treatment provided mild relief.      Review of Systems  Constitutional: Negative.  Negative for fever, chills, diaphoresis, appetite change and fatigue.  HENT: Positive for postnasal drip, sinus pressure and sore throat. Negative for congestion, ear pain, facial swelling, hearing loss, hoarse voice, mouth sores, nosebleeds, rhinorrhea, sneezing, tinnitus, trouble swallowing and voice change.        He has pain over his left face in the area of the TMJ, the pain occurs when he opens and closes his mouth. He has not taken anything for the pain.  Eyes: Negative.   Respiratory: Negative.  Negative for apnea, cough, choking, chest tightness, shortness of breath, wheezing and stridor.   Cardiovascular: Negative.  Negative for chest pain, palpitations and leg swelling.  Gastrointestinal: Negative.  Negative for nausea, vomiting, abdominal pain, diarrhea, constipation and blood in stool.  Endocrine: Negative.   Genitourinary: Negative.   Musculoskeletal: Negative.  Negative for neck pain.  Skin: Negative.   Allergic/Immunologic: Negative.   Neurological: Negative.  Negative for dizziness, tremors, facial asymmetry, weakness, light-headedness and headaches.  Hematological: Negative.  Negative for adenopathy. Does not bruise/bleed easily.  Psychiatric/Behavioral: Negative.        Objective:   Physical Exam  Vitals reviewed. Constitutional: He is oriented to person, place,  and time. He appears well-developed and well-nourished.  Non-toxic appearance. He does not have a sickly appearance. He does not appear ill. No distress.  HENT:  Head: Normocephalic and atraumatic.  Right Ear: Hearing, tympanic membrane, external ear and ear canal normal.  Left Ear: Hearing, tympanic membrane, external ear and ear canal normal.  Nose: Mucosal edema and rhinorrhea present. No nose lacerations, sinus tenderness, nasal deformity, septal deviation or nasal septal hematoma. No epistaxis.  No foreign bodies. Right sinus exhibits no maxillary sinus tenderness and no frontal sinus tenderness. Left sinus exhibits maxillary sinus tenderness. Left sinus exhibits no frontal sinus tenderness.  Mouth/Throat: Oropharynx is clear and moist. Mucous membranes are not pale, not dry and not cyanotic. No oral lesions. No trismus in the jaw. No uvula swelling. No oropharyngeal exudate, posterior oropharyngeal edema, posterior oropharyngeal erythema or tonsillar abscesses.  There is some crepitance in the left TMJ but there is FROM with no swelling.  Eyes: Conjunctivae are normal. Right eye exhibits no discharge. Left eye exhibits no discharge. No scleral icterus.  Neck: Normal range of motion. Neck supple. No JVD present. No tracheal deviation present. No thyromegaly present.  Cardiovascular: Normal rate, regular rhythm, normal heart sounds and intact distal pulses.  Exam reveals no gallop and no friction rub.   No murmur heard. Pulmonary/Chest: Effort normal and breath sounds normal. No stridor. No respiratory distress. He has no wheezes. He has no rales. He exhibits no tenderness.  Abdominal: Soft. Bowel sounds are normal. He exhibits no distension and no mass. There is no tenderness. There is no rebound and no guarding.  Musculoskeletal: Normal range of motion. He exhibits no edema and no tenderness.  Lymphadenopathy:  He has no cervical adenopathy.  Neurological: He is oriented to person, place,  and time.  Skin: Skin is warm and dry. No rash noted. He is not diaphoretic. No erythema. No pallor.          Assessment & Plan:

## 2013-05-13 ENCOUNTER — Encounter: Payer: Self-pay | Admitting: Physician Assistant

## 2013-05-13 ENCOUNTER — Ambulatory Visit (INDEPENDENT_AMBULATORY_CARE_PROVIDER_SITE_OTHER): Payer: BC Managed Care – PPO | Admitting: Physician Assistant

## 2013-05-13 VITALS — BP 118/70 | HR 59 | Temp 97.9°F | Resp 16 | Wt 231.0 lb

## 2013-05-13 DIAGNOSIS — R197 Diarrhea, unspecified: Secondary | ICD-10-CM | POA: Insufficient documentation

## 2013-05-13 NOTE — Patient Instructions (Signed)
Please follow the diet below for diarrhea.  Can continue to use the Immodium if needed for diarrhea symptoms.  Continue fiber supplement and begin the Align probiotic.  I will call you with your lab results.  If symptoms acutely worsen please proceed to the ER.  If labs abnormal, we will treat accordingly.

## 2013-05-13 NOTE — Progress Notes (Signed)
Pre visit review using our clinic review tool, if applicable. No additional management support is needed unless otherwise documented below in the visit note. °

## 2013-05-13 NOTE — Assessment & Plan Note (Signed)
Physical exam unremarkable for tenderness with slightly hyperactive bowel sounds. Will obtain CBC, BMP, stool culture, fecal white blood cells, ova and parasite and C. difficile assay. Discussed possible need for imaging. Patient wishes to have labs obtained first. Patient instructed to obtain labs at [facility], since labs not available at [facility]. Patient states he will come back to [facility] on Monday for lab work. Patient given handout on BRAT diet. Can continue Imodium if needed. Continue fiber supplement and restart probiotic. Patient instructed on alarm symptoms, and when to present to the emergency department.

## 2013-05-13 NOTE — Progress Notes (Signed)
Patient ID: Manuel Bauer, male   DOB: 11-13-1965, 48 y.o.   MRN: [id number]  Patient presents to clinic today complaining of 5 days of nonbloody diarrhea. Patient endorses several frequent, loose stools per day.  Patient states he's been taking some Imodium with slight improvement of symptoms. Patient does endorse abdominal bloating, but denies abdominal pain. Patient does have history of irritable bowel syndrome that is constipation-dominant. Patient denies recent travel or sick contact. Denies tenesmus, melena or hematochezia. Denies fever, chills, malaise, fatigue, night sweats or weight loss. Denies abnormal food or water source. States his son had 24 hours of similar symptoms that has since resolved.  Past Medical History  Diagnosis Date   Reflux    Hiatal hernia    Gastritis    Arthritis    Depression    Chronic low back pain 2008   H/O folliculitis 2010    No current outpatient prescriptions on file prior to visit.   No current facility-administered medications on file prior to visit.    No Known Allergies  Family History  Problem Relation Age of Onset   Adopted: Yes    History   Social History   Marital Status: Married    Spouse Name: N/A    Number of Children: N/A   Years of Education: N/A   Social History Main Topics   Smoking status: Never Smoker    Smokeless tobacco: Never Used   Alcohol Use: 1.8 oz/week    3 Cans of beer per week     Comment: 1-3 drinks per month   Drug Use: No   Sexual Activity: Yes   Other Topics Concern   None   Social History Narrative   Two cups of coffee a day   Review of Systems - See HPI.  All other ROS are negative.  Filed Vitals:   05/13/13 1053  BP: 118/70  Pulse: 59  Temp: 97.9 F (36.6 C)  Resp: 16   Physical Exam  Vitals reviewed. Constitutional: He is oriented to person, place, and time and well-developed, well-nourished, and in no distress.  HENT:  Head: Normocephalic and atraumatic.  Eyes:  Conjunctivae are normal. Pupils are equal, round, and reactive to light.  Neck: Neck supple.  Cardiovascular: Normal rate, regular rhythm and normal heart sounds.   Pulmonary/Chest: Effort normal and breath sounds normal.  Abdominal: Soft. He exhibits no distension and no mass. There is no tenderness. There is no rebound and no guarding.  Bowel sounds slightly hyperactive. Bowel sounds present in all 4 quadrants.  Lymphadenopathy:    He has no cervical adenopathy.  Neurological: He is alert and oriented to person, place, and time.  Skin: Skin is warm and dry. No rash noted.  Psychiatric: Affect normal.   Assessment/Plan: No problem-specific assessment & plan notes found for this encounter.

## 2013-05-14 ENCOUNTER — Emergency Department: Payer: Self-pay | Admitting: Emergency Medicine

## 2013-05-14 LAB — CBC WITH DIFFERENTIAL/PLATELET
BASOS ABS: 0.1 10*3/uL (ref 0.0–0.1)
Basophil %: 0.5 %
EOS ABS: 0.4 10*3/uL (ref 0.0–0.7)
Eosinophil %: 2.9 %
HCT: 48.9 % (ref 40.0–52.0)
HGB: 17.2 g/dL (ref 13.0–18.0)
LYMPHS PCT: 19.4 %
Lymphocyte #: 2.3 10*3/uL (ref 1.0–3.6)
MCH: 31.5 pg (ref 26.0–34.0)
MCHC: 35.2 g/dL (ref 32.0–36.0)
MCV: 89 fL (ref 80–100)
MONO ABS: 1.5 x10 3/mm — AB (ref 0.2–1.0)
Monocyte %: 12.2 %
NEUTROS ABS: 7.8 10*3/uL — AB (ref 1.4–6.5)
Neutrophil %: 65 %
Platelet: 237 10*3/uL (ref 150–440)
RBC: 5.48 10*6/uL (ref 4.40–5.90)
RDW: 12.4 % (ref 11.5–14.5)
WBC: 12 10*3/uL — AB (ref 3.8–10.6)

## 2013-05-14 LAB — COMPREHENSIVE METABOLIC PANEL
ALK PHOS: 98 U/L
ANION GAP: 3 — AB (ref 7–16)
Albumin: 4.2 g/dL (ref 3.4–5.0)
BILIRUBIN TOTAL: 0.7 mg/dL (ref 0.2–1.0)
BUN: 14 mg/dL (ref 7–18)
CHLORIDE: 104 mmol/L (ref 98–107)
CO2: 28 mmol/L (ref 21–32)
Calcium, Total: 9.1 mg/dL (ref 8.5–10.1)
Creatinine: 0.99 mg/dL (ref 0.60–1.30)
EGFR (African American): >60
EGFR (Non-African Amer.): >60
Glucose: 94 mg/dL (ref 65–99)
Osmolality: 270 (ref 275–301)
Potassium: 3.5 mmol/L (ref 3.5–5.1)
SGOT(AST): 33 U/L (ref 15–37)
SGPT (ALT): 58 U/L (ref 12–78)
Sodium: 135 mmol/L — ABNORMAL LOW (ref 136–145)
Total Protein: 8.2 g/dL (ref 6.4–8.2)

## 2013-05-14 LAB — URINALYSIS, COMPLETE
BLOOD: NEGATIVE
Bacteria: NONE SEEN
Bilirubin,UR: NEGATIVE
GLUCOSE, UR: NEGATIVE mg/dL (ref 0–75)
Ketone: NEGATIVE
LEUKOCYTE ESTERASE: NEGATIVE
Nitrite: NEGATIVE
Ph: 6 (ref 4.5–8.0)
Protein: NEGATIVE
SPECIFIC GRAVITY: 1.015 (ref 1.003–1.030)
Squamous Epithelial: NONE SEEN
WBC UR: <1 /HPF (ref 0–5)

## 2013-05-14 LAB — LIPASE, BLOOD: Lipase: 159 U/L (ref 73–393)

## 2013-05-15 LAB — WBCS, STOOL

## 2013-05-16 LAB — STOOL CULTURE

## 2013-08-09 ENCOUNTER — Ambulatory Visit (INDEPENDENT_AMBULATORY_CARE_PROVIDER_SITE_OTHER): Payer: BC Managed Care – PPO | Admitting: Internal Medicine

## 2013-08-09 ENCOUNTER — Encounter: Payer: Self-pay | Admitting: Internal Medicine

## 2013-08-09 VITALS — BP 118/78 | HR 54 | Temp 98.1°F | Resp 16 | Ht 74.0 in | Wt 233.0 lb

## 2013-08-09 DIAGNOSIS — L909 Atrophic disorder of skin, unspecified: Secondary | ICD-10-CM

## 2013-08-09 DIAGNOSIS — L919 Hypertrophic disorder of the skin, unspecified: Secondary | ICD-10-CM

## 2013-08-09 DIAGNOSIS — L918 Other hypertrophic disorders of the skin: Secondary | ICD-10-CM | POA: Insufficient documentation

## 2013-08-09 NOTE — Progress Notes (Signed)
Pre visit review using our clinic review tool, if applicable. No additional management support is needed unless otherwise documented below in the visit note. °

## 2013-08-09 NOTE — Progress Notes (Signed)
° °  Subjective:    Patient ID: Manuel Bauer, male    DOB: [birth year], 48 y.o.   MRN: [id number]  HPI Comments: He has skin tags on his neck and one on his perineum that he wants for me to look at, they do not bother him.     Review of Systems  All other systems reviewed and are negative.       Objective:   Physical Exam  Skin:  Skin noted over anterior perineum - fleshy pedunculated lesion on a stalk with normal epithelium     Lab Results  Component Value Date   WBC 4.8 10/13/2012   HGB 15.2 10/13/2012   HCT 43.7 10/13/2012   PLT 192.0 10/13/2012   GLUCOSE 91 10/13/2012   CHOL 169 10/13/2012   TRIG 159.0* 10/13/2012   HDL 32.50* 10/13/2012   LDLCALC 105* 10/13/2012   ALT 36 10/13/2012   AST 29 10/13/2012   NA 140 10/13/2012   K 4.1 10/13/2012   CL 105 10/13/2012   CREATININE 0.9 10/13/2012   BUN 10 10/13/2012   CO2 28 10/13/2012   TSH 1.63 10/13/2012   PSA 1.13 09/03/2011       Assessment & Plan:

## 2013-08-09 NOTE — Assessment & Plan Note (Signed)
I reassured him that this is normal skin that does not require any intervention He does not want for me to remove it

## 2013-08-09 NOTE — Patient Instructions (Signed)
Place skin tag removal patient instructions here.

## 2013-10-09 ENCOUNTER — Telehealth: Payer: Self-pay | Admitting: Internal Medicine

## 2013-10-09 NOTE — Telephone Encounter (Signed)
Left message for pt to call back. °

## 2013-10-11 NOTE — Telephone Encounter (Signed)
Discussed with pt that per EGD report he had a small hiatal hernia. Pt does not think that will be a problem with Scuba diving.

## 2013-10-17 ENCOUNTER — Ambulatory Visit (INDEPENDENT_AMBULATORY_CARE_PROVIDER_SITE_OTHER): Payer: BC Managed Care – PPO | Admitting: Family Medicine

## 2013-10-17 VITALS — BP 110/72 | HR 67 | Temp 97.8°F | Resp 12 | Ht 74.25 in | Wt 224.0 lb

## 2013-10-17 DIAGNOSIS — Z0000 Encounter for general adult medical examination without abnormal findings: Secondary | ICD-10-CM

## 2013-10-17 LAB — LIPID PANEL
CHOLESTEROL: 196 mg/dL (ref 0–200)
HDL: 38 mg/dL — ABNORMAL LOW (ref >39)
LDL CALC: 118 mg/dL — AB (ref 0–99)
Total CHOL/HDL Ratio: 5.2 Ratio
Triglycerides: 202 mg/dL — ABNORMAL HIGH (ref <150)
VLDL: 40 mg/dL (ref 0–40)

## 2013-10-17 LAB — COMPREHENSIVE METABOLIC PANEL
ALT: 36 U/L (ref 0–53)
AST: 27 U/L (ref 0–37)
Albumin: 4.7 g/dL (ref 3.5–5.2)
Alkaline Phosphatase: 83 U/L (ref 39–117)
BILIRUBIN TOTAL: 0.7 mg/dL (ref 0.2–1.2)
BUN: 11 mg/dL (ref 6–23)
CHLORIDE: 102 meq/L (ref 96–112)
CO2: 28 meq/L (ref 19–32)
Calcium: 9.6 mg/dL (ref 8.4–10.5)
Creat: 1.01 mg/dL (ref 0.50–1.35)
Glucose, Bld: 95 mg/dL (ref 70–99)
Potassium: 4.2 mEq/L (ref 3.5–5.3)
SODIUM: 137 meq/L (ref 135–145)
TOTAL PROTEIN: 7.5 g/dL (ref 6.0–8.3)

## 2013-10-17 LAB — POCT CBC
GRANULOCYTE PERCENT: 45.9 % (ref 37–80)
HCT, POC: 49.9 % (ref 43.5–53.7)
HEMOGLOBIN: 16.6 g/dL (ref 14.1–18.1)
Lymph, poc: 2.5 (ref 0.6–3.4)
MCH, POC: 31.9 pg — AB (ref 27–31.2)
MCHC: 33.3 g/dL (ref 31.8–35.4)
MCV: 95.9 fL (ref 80–97)
MID (cbc): 0.5 (ref 0–0.9)
MPV: 10.7 fL (ref 0–99.8)
POC GRANULOCYTE: 2.6 (ref 2–6.9)
POC LYMPH PERCENT: 45.5 %L (ref 10–50)
POC MID %: 8.6 % (ref 0–12)
Platelet Count, POC: 257 10*3/uL (ref 142–424)
RBC: 5.2 M/uL (ref 4.69–6.13)
RDW, POC: 13 %
WBC: 5.6 10*3/uL (ref 4.6–10.2)

## 2013-10-17 NOTE — Progress Notes (Signed)
Subjective:    Patient ID: Manuel Bauer, male    DOB: 1965/10/09, 48 y.o.   MRN: [id number]  HPI Manuel Linger, MD -PCP, unable to schedule physical there, last CPE last year.   Manuel Bauer is a 48 y.o. 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 He is going to [place] for a scout retreat. They are doing a scuba merit badge. The scout team is going to [place]. He starts scuba training on Thursday. He had situational depression surrounding a job situation in 2006, no recent psychiatric illness. No claustrophobia. Pt has a hiatal hernia. 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 Pt is a Clinical research associate. States his job can be stressful. Pt wears glasses; occasionally wears contacts when playing soccer. Plays soccer about three times a week. No limitations with every day activities. Thinks he had a Tdap back in 2007 when he stepped on a nail in [place]. Pt is adopted, unknown FH. Non smoker. Occasional joint and back pain, but nothing that the pt is worried about at present or limiting in activities. No chest pain with exercise. Pt is married. No risk of STI. Saw his dentist back in February and has another appointment in August. Last saw his eye doctor about two years ago. Had an EKG back in January with normal results.   No other acute concerns today.   Review of Systems 13 point review of systems per patient health survey noted.  Negative other than as indicated above.     Objective:   Physical Exam  Vitals reviewed. Constitutional: He is oriented to person, place, and time. He appears well-developed and well-nourished.  HENT:  Head: Normocephalic and atraumatic.  Right Ear: External ear normal.  Left Ear: External ear normal.  Mouth/Throat: Oropharynx is clear and moist.  Eyes: Conjunctivae and EOM are normal. Pupils are equal, round, and reactive to light.  Neck: Normal range of motion. Neck supple. No thyromegaly present.   Cardiovascular: Normal rate, regular rhythm, normal heart sounds and intact distal pulses.   Pulmonary/Chest: Effort normal and breath sounds normal. No respiratory distress. He has no wheezes.  Abdominal: Soft. He exhibits no distension. There is no tenderness. Hernia confirmed negative in the right inguinal area and confirmed negative in the left inguinal area.  Musculoskeletal: Normal range of motion. He exhibits no edema and no tenderness.  Lymphadenopathy:    He has no cervical adenopathy.  Neurological: He is alert and oriented to person, place, and time. He has normal reflexes.  Skin: Skin is warm and dry.  Psychiatric: He has a normal mood and affect. His behavior is normal.       Assessment & Plan:  Manuel Bauer is a 48 y.o. male Normal routine physical examination - Plan: POCT CBC, Comprehensive metabolic panel, Lipid panel, TSH  Age appropriate CPE, forms completed for [employer] camp and scuba training.  No hx of ear rupture/disorder, no hx of heart disease known or other concerns on hx or exam.  Routine labs above and continue care with PCP.  Anticipatory guidance given.   Patient Instructions  You should receive a call or letter about your lab results within the next week to 10 days.Keeping you healthy  Get these tests  Blood pressure- Have your blood pressure checked once a year by your healthcare provider.  Normal blood pressure is 120/80.  Weight- Have your body mass index (BMI) calculated to screen for obesity.  BMI is a  measure of body fat based on height and weight. You can also calculate your own BMI at https://www.west-esparza.com/www.nhlbisupport.com/bmi/.  Cholesterol- Have your cholesterol checked regularly starting at age 47, sooner may be necessary if you have diabetes, high blood pressure, if a family member developed heart diseases at an early age or if you smoke.   Chlamydia, HIV, and other sexual transmitted disease- Get screened each year until the age of 47 then within three months of  each new sexual partner.  Diabetes- Have your blood sugar checked regularly if you have high blood pressure, high cholesterol, a family history of diabetes or if you are overweight.  Get these vaccines  Flu shot- Every fall.  Tetanus shot- Every 10 years.  Menactra- Single dose; prevents meningitis.  Take these steps  Don't smoke- If you do smoke, ask your healthcare provider about quitting. For tips on how to quit, go to www.smokefree.gov or call 1-800-QUIT-NOW.  Be physically active- Exercise 5 days a week for at least 30 minutes.  If you are not already physically active start slow and gradually work up to 30 minutes of moderate physical activity.  Examples of moderate activity include walking briskly, mowing the yard, dancing, swimming bicycling, etc.  Eat a healthy diet- Eat a variety of healthy foods such as fruits, vegetables, low fat milk, low fat cheese, yogurt, lean meats, poultry, fish, beans, tofu, etc.  For more information on healthy eating, go to www.thenutritionsource.org  Drink alcohol in moderation- Limit alcohol intake two drinks or less a day.  Never drink and drive.  Dentist- Brush and floss teeth twice daily; visit your dentis twice a year.  Depression-Your emotional health is as important as your physical health.  If you're feeling down, losing interest in things you normally enjoy please talk with your healthcare provider.  Gun Safety- If you keep a gun in your home, keep it unloaded and with the safety lock on.  Bullets should be stored separately.  Helmet use- Always wear a helmet when riding a motorcycle, bicycle, rollerblading or skateboarding.  Safe sex- If you may be exposed to a sexually transmitted infection, use a condom  Seat belts- Seat bels can save your life; always wear one.  Smoke/Carbon Monoxide detectors- These detectors need to be installed on the appropriate level of your home.  Replace batteries at least once a year.  Skin Cancer- When  out in the sun, cover up and use sunscreen SPF 15 or higher. Violence- If anyone is threatening or hurting you, please tell your healthcare provider.

## 2013-10-17 NOTE — Patient Instructions (Addendum)
You should receive a call or letter about your lab results within the next week to 10 days.Keeping you healthy  Get these tests  Blood pressure- Have your blood pressure checked once a year by your healthcare provider.  Normal blood pressure is 120/80.  Weight- Have your body mass index (BMI) calculated to screen for obesity.  BMI is a measure of body fat based on height and weight. You can also calculate your own BMI at https://www.west-esparza.com/.  Cholesterol- Have your cholesterol checked regularly starting at age 79, sooner may be necessary if you have diabetes, high blood pressure, if a family member developed heart diseases at an early age or if you smoke.   Chlamydia, HIV, and other sexual transmitted disease- Get screened each year until the age of 72 then within three months of each new sexual partner.  Diabetes- Have your blood sugar checked regularly if you have high blood pressure, high cholesterol, a family history of diabetes or if you are overweight.  Get these vaccines  Flu shot- Every fall.  Tetanus shot- Every 10 years.  Menactra- Single dose; prevents meningitis.  Take these steps  Don't smoke- If you do smoke, ask your healthcare provider about quitting. For tips on how to quit, go to www.smokefree.gov or call 1-800-QUIT-NOW.  Be physically active- Exercise 5 days a week for at least 30 minutes.  If you are not already physically active start slow and gradually work up to 30 minutes of moderate physical activity.  Examples of moderate activity include walking briskly, mowing the yard, dancing, swimming bicycling, etc.  Eat a healthy diet- Eat a variety of healthy foods such as fruits, vegetables, low fat milk, low fat cheese, yogurt, lean meats, poultry, fish, beans, tofu, etc.  For more information on healthy eating, go to www.thenutritionsource.org  Drink alcohol in moderation- Limit alcohol intake two drinks or less a day.  Never drink and drive.  Dentist- Brush  and floss teeth twice daily; visit your dentis twice a year.  Depression-Your emotional health is as important as your physical health.  If you're feeling down, losing interest in things you normally enjoy please talk with your healthcare provider.  Gun Safety- If you keep a gun in your home, keep it unloaded and with the safety lock on.  Bullets should be stored separately.  Helmet use- Always wear a helmet when riding a motorcycle, bicycle, rollerblading or skateboarding.  Safe sex- If you may be exposed to a sexually transmitted infection, use a condom  Seat belts- Seat bels can save your life; always wear one.  Smoke/Carbon Monoxide detectors- These detectors need to be installed on the appropriate level of your home.  Replace batteries at least once a year.  Skin Cancer- When out in the sun, cover up and use sunscreen SPF 15 or higher. Violence- If anyone is threatening or hurting you, please tell your healthcare provider.

## 2013-10-18 LAB — TSH: TSH: 2.324 u[IU]/mL (ref 0.350–4.500)

## 2013-10-25 ENCOUNTER — Encounter: Payer: BC Managed Care – PPO | Admitting: Internal Medicine

## 2014-01-01 ENCOUNTER — Other Ambulatory Visit (INDEPENDENT_AMBULATORY_CARE_PROVIDER_SITE_OTHER): Payer: BC Managed Care – PPO

## 2014-01-01 ENCOUNTER — Ambulatory Visit (INDEPENDENT_AMBULATORY_CARE_PROVIDER_SITE_OTHER): Payer: BC Managed Care – PPO | Admitting: Internal Medicine

## 2014-01-01 ENCOUNTER — Encounter: Payer: Self-pay | Admitting: Internal Medicine

## 2014-01-01 ENCOUNTER — Ambulatory Visit (INDEPENDENT_AMBULATORY_CARE_PROVIDER_SITE_OTHER)
Admission: RE | Admit: 2014-01-01 | Discharge: 2014-01-01 | Disposition: A | Discharge status: Home / Self Care | Payer: BC Managed Care – PPO | Source: Ambulatory Visit | Attending: Internal Medicine | Admitting: Internal Medicine

## 2014-01-01 ENCOUNTER — Other Ambulatory Visit: Payer: Self-pay | Admitting: Internal Medicine

## 2014-01-01 VITALS — BP 120/70 | HR 57 | Temp 98.6°F | Wt 228.5 lb

## 2014-01-01 DIAGNOSIS — R109 Unspecified abdominal pain: Secondary | ICD-10-CM

## 2014-01-01 LAB — URINALYSIS
Bilirubin Urine: NEGATIVE
Ketones, ur: NEGATIVE
Leukocytes, UA: NEGATIVE
NITRITE: NEGATIVE
Specific Gravity, Urine: 1.01 (ref 1.000–1.030)
TOTAL PROTEIN, URINE-UPE24: NEGATIVE
UROBILINOGEN UA: 0.2 (ref 0.0–1.0)
Urine Glucose: NEGATIVE
pH: 6 (ref 5.0–8.0)

## 2014-01-01 MED ORDER — TRAMADOL HCL 50 MG PO TABS: ORAL_TABLET | ORAL | Status: DC

## 2014-01-01 MED ORDER — CYCLOBENZAPRINE HCL 5 MG PO TABS: ORAL_TABLET | ORAL | Status: DC

## 2014-01-01 NOTE — Progress Notes (Signed)
° °  Subjective:    Patient ID: Manuel Bauer, male    DOB: 07-21-1965, 48 y.o.   MRN: [id number]  HPI   He has had bilateral flank pain, right greater than left for 5-6 weeks. There was no injury or repetitive motion prior to onset of symptoms. He does play soccer but has had no sports related injury.  He has seen a chiropractor intermittently in the past for manipulation. He has no specific musculoskeletal diagnosis.  This pain is worse sitting and in the lateral decubitus position at night.  He has no genitourinary symptoms.  He denies any neuromuscular symptoms of significance either.    Review of Systems Fever, chills, sweats, or unexplained weight loss not present. There is no numbness, tingling, or weakness in extremities.  No loss of control of bladder or bowels. Radicular type pain absent.  Dysuria, pyuria, hematuria, frequency, nocturia or polyuria are denied.     Objective:   Physical Exam General appearance is one of good health and nourishment w/o distress.  Eyes: No conjunctival inflammation or scleral icterus is present.  Oral exam: Dental hygiene is good; lips and gums are healthy appearing.There is no oropharyngeal erythema or exudate noted.   Heart:  Normal rate and regular rhythm. S1 and S2 normal without gallop, murmur, click, rub or other extra sounds     Lungs:Chest clear to auscultation; no wheezes, rhonchi,rales ,or rubs present.No increased work of breathing.   Abdomen: bowel sounds normal, soft and non-tender without masses, organomegaly or hernias noted.  No guarding or rebound . No tenderness over the flanks to percussion  Musculoskeletal: Able to lie flat and sit up without help. Negative straight leg raising bilaterally. Gait normal  Skin:Slightly damp.  Intact without suspicious lesions or rashes ; no jaundice or tenting  Lymphatic: No lymphadenopathy is noted about the head, neck, axilla               Assessment & Plan:  #1 R > L  flank pain in  Inferior thoracic spine area See orders & AVS

## 2014-01-01 NOTE — Patient Instructions (Signed)
The best exercises for the  back include freestyle swimming, stretch aerobics, and yoga.Cybex & Nautilus machines rather than dead weights are better for the back.

## 2014-01-01 NOTE — Progress Notes (Signed)
Pre visit review using our clinic review tool, if applicable. No additional management support is needed unless otherwise documented below in the visit note. ° °

## 2014-01-24 ENCOUNTER — Encounter: Payer: Self-pay | Admitting: Internal Medicine

## 2014-01-24 ENCOUNTER — Ambulatory Visit (INDEPENDENT_AMBULATORY_CARE_PROVIDER_SITE_OTHER): Payer: BC Managed Care – PPO | Admitting: Internal Medicine

## 2014-01-24 VITALS — BP 118/82 | HR 65 | Temp 98.1°F | Resp 16 | Ht 74.5 in | Wt 227.0 lb

## 2014-01-24 DIAGNOSIS — R109 Unspecified abdominal pain: Secondary | ICD-10-CM

## 2014-01-24 DIAGNOSIS — R319 Hematuria, unspecified: Secondary | ICD-10-CM

## 2014-01-24 NOTE — Patient Instructions (Signed)
Flank Pain °Flank pain refers to pain that is located on the side of the body between the upper abdomen and the back. The pain may occur over a short period of time (acute) or may be long-term or reoccurring (chronic). It may be mild or severe. Flank pain can be caused by many things. °CAUSES  °Some of the more common causes of flank pain include: °· Muscle strains.   °· Muscle spasms.   °· A disease of your spine (vertebral disk disease).   °· A lung infection (pneumonia).   °· Fluid around your lungs (pulmonary edema).   °· A kidney infection.   °· Kidney stones.   °· A very painful skin rash caused by the chickenpox virus (shingles).   °· Gallbladder disease.   °HOME CARE INSTRUCTIONS  °Home care will depend on the cause of your pain. In general, °· Rest as directed by your caregiver. °· Drink enough fluids to keep your urine clear or pale yellow. °· Only take over-the-counter or prescription medicines as directed by your caregiver. Some medicines may help relieve the pain. °· Tell your caregiver about any changes in your pain. °· Follow up with your caregiver as directed. °SEEK IMMEDIATE MEDICAL CARE IF:  °· Your pain is not controlled with medicine.   °· You have new or worsening symptoms. °· Your pain increases.   °· You have abdominal pain.   °· You have shortness of breath.   °· You have persistent nausea or vomiting.   °· You have swelling in your abdomen.   °· You feel faint or pass out.   °· You have blood in your urine. °· You have a fever or persistent symptoms for more than 2-3 days. °· You have a fever and your symptoms suddenly get worse. °MAKE SURE YOU:  °· Understand these instructions. °· Will watch your condition. °· Will get help right away if you are not doing well or get worse. °Document Released: 06/18/2005 Document Revised: 01/20/2012 Document Reviewed: 12/10/2011 °ExitCare® Patient Information ©2015 [employer]. This information is not intended to replace advice given to you by your  health care provider. Make sure you discuss any questions you have with your health care provider. ° °

## 2014-01-24 NOTE — Assessment & Plan Note (Signed)
I will check his renal function and CBC to look for organic illness and will get a CT scan done to look for mass, stones, etc

## 2014-01-24 NOTE — Assessment & Plan Note (Signed)
I will check his renal function and have ordered a CT to look for mass, stones, etc

## 2014-01-24 NOTE — Progress Notes (Signed)
Subjective:    Patient ID: Manuel Bauer, male    DOB: [birth year], 48 y.o.   MRN: [id number]  Flank Pain This is a recurrent problem. The current episode started 1 to 4 weeks ago. The problem occurs intermittently. The problem is unchanged. Pain location: bilateral lower flanks. The quality of the pain is described as aching. The pain does not radiate. The pain is at a severity of 2/10. The pain is mild. The pain is worse during the day. Pertinent negatives include no abdominal pain, bladder incontinence, bowel incontinence, chest pain, dysuria, fever, headaches, leg pain, numbness, paresis, paresthesias, pelvic pain, perianal numbness, tingling, weakness or weight loss. He has tried NSAIDs for the symptoms. The treatment provided moderate relief.      Review of Systems  Constitutional: Negative.  Negative for fever, chills, weight loss, diaphoresis, appetite change and fatigue.  HENT: Negative.   Eyes: Negative.   Respiratory: Negative.  Negative for cough, choking, chest tightness, shortness of breath and stridor.   Cardiovascular: Negative.  Negative for chest pain, palpitations and leg swelling.  Gastrointestinal: Negative.  Negative for nausea, vomiting, abdominal pain, diarrhea, constipation, blood in stool, abdominal distention, anal bleeding, rectal pain and bowel incontinence.  Endocrine: Negative.   Genitourinary: Positive for flank pain. Negative for bladder incontinence, dysuria, urgency, frequency, hematuria, decreased urine volume, discharge, penile swelling, scrotal swelling, enuresis, difficulty urinating, genital sores, penile pain, testicular pain and pelvic pain.  Musculoskeletal: Negative.  Negative for arthralgias, back pain, myalgias and neck pain.  Skin: Negative.  Negative for rash.  Allergic/Immunologic: Negative.   Neurological: Negative.  Negative for dizziness, tingling, weakness, numbness, headaches and paresthesias.  Hematological: Negative.  Negative for  adenopathy. Does not bruise/bleed easily.  Psychiatric/Behavioral: Negative.        Objective:   Physical Exam  Vitals reviewed. Constitutional: He is oriented to person, place, and time. He appears well-developed and well-nourished. No distress.  HENT:  Head: Normocephalic and atraumatic.  Mouth/Throat: Oropharynx is clear and moist. No oropharyngeal exudate.  Eyes: Conjunctivae are normal. Right eye exhibits no discharge. Left eye exhibits no discharge. No scleral icterus.  Neck: Normal range of motion. Neck supple. No JVD present. No tracheal deviation present. No thyromegaly present.  Cardiovascular: Normal rate, regular rhythm, normal heart sounds and intact distal pulses.  Exam reveals no gallop and no friction rub.   No murmur heard. Pulmonary/Chest: Effort normal and breath sounds normal. No stridor. No respiratory distress. He has no wheezes. He has no rales. He exhibits no tenderness.  Abdominal: Soft. Bowel sounds are normal. He exhibits no distension and no mass. There is no tenderness. There is no rebound and no guarding.  Musculoskeletal: Normal range of motion. He exhibits no edema and no tenderness.  Lymphadenopathy:    He has no cervical adenopathy.  Neurological: He is oriented to person, place, and time.  Skin: Skin is warm and dry. No rash noted. He is not diaphoretic. No erythema. No pallor.     Lab Results  Component Value Date   WBC 5.6 10/17/2013   HGB 16.6 10/17/2013   HCT 49.9 10/17/2013   PLT 192.0 10/13/2012   GLUCOSE 95 10/17/2013   CHOL 196 10/17/2013   TRIG 202* 10/17/2013   HDL 38* 10/17/2013   LDLCALC 118* 10/17/2013   ALT 36 10/17/2013   AST 27 10/17/2013   NA 137 10/17/2013   K 4.2 10/17/2013   CL 102 10/17/2013   CREATININE 1.01 10/17/2013   BUN 11 10/17/2013  CO2 28 10/17/2013   TSH 2.324 10/17/2013   PSA 1.13 09/03/2011       Assessment & Plan:

## 2014-01-25 ENCOUNTER — Encounter: Payer: Self-pay | Admitting: Internal Medicine

## 2014-01-25 ENCOUNTER — Other Ambulatory Visit (INDEPENDENT_AMBULATORY_CARE_PROVIDER_SITE_OTHER): Payer: BC Managed Care – PPO

## 2014-01-25 DIAGNOSIS — R319 Hematuria, unspecified: Secondary | ICD-10-CM

## 2014-01-25 DIAGNOSIS — R109 Unspecified abdominal pain: Secondary | ICD-10-CM

## 2014-01-25 LAB — CBC WITH DIFFERENTIAL/PLATELET
Basophils Absolute: 0 10*3/uL (ref 0.0–0.1)
Basophils Relative: 0.6 % (ref 0.0–3.0)
EOS PCT: 3.3 % (ref 0.0–5.0)
Eosinophils Absolute: 0.2 10*3/uL (ref 0.0–0.7)
HCT: 42.4 % (ref 39.0–52.0)
Hemoglobin: 14.7 g/dL (ref 13.0–17.0)
LYMPHS PCT: 42.4 % (ref 12.0–46.0)
Lymphs Abs: 2.3 10*3/uL (ref 0.7–4.0)
MCHC: 34.7 g/dL (ref 30.0–36.0)
MCV: 92.3 fl (ref 78.0–100.0)
MONOS PCT: 11.4 % (ref 3.0–12.0)
Monocytes Absolute: 0.6 10*3/uL (ref 0.1–1.0)
NEUTROS PCT: 42.3 % — AB (ref 43.0–77.0)
Neutro Abs: 2.3 10*3/uL (ref 1.4–7.7)
PLATELETS: 208 10*3/uL (ref 150.0–400.0)
RBC: 4.59 Mil/uL (ref 4.22–5.81)
RDW: 12 % (ref 11.5–15.5)
WBC: 5.3 10*3/uL (ref 4.0–10.5)

## 2014-01-25 LAB — COMPREHENSIVE METABOLIC PANEL
ALBUMIN: 4.2 g/dL (ref 3.5–5.2)
ALT: 36 U/L (ref 0–53)
AST: 40 U/L — ABNORMAL HIGH (ref 0–37)
Alkaline Phosphatase: 70 U/L (ref 39–117)
BUN: 18 mg/dL (ref 6–23)
CO2: 28 meq/L (ref 19–32)
Calcium: 9.3 mg/dL (ref 8.4–10.5)
Chloride: 103 mEq/L (ref 96–112)
Creatinine, Ser: 1.1 mg/dL (ref 0.4–1.5)
GFR: 77.98 mL/min (ref >60.00)
GLUCOSE: 97 mg/dL (ref 70–99)
POTASSIUM: 4.1 meq/L (ref 3.5–5.1)
SODIUM: 139 meq/L (ref 135–145)
TOTAL PROTEIN: 7.3 g/dL (ref 6.0–8.3)
Total Bilirubin: 0.8 mg/dL (ref 0.2–1.2)

## 2014-02-09 ENCOUNTER — Ambulatory Visit (INDEPENDENT_AMBULATORY_CARE_PROVIDER_SITE_OTHER)
Admission: RE | Admit: 2014-02-09 | Discharge: 2014-02-09 | Disposition: A | Discharge status: Home / Self Care | Payer: BC Managed Care – PPO | Source: Ambulatory Visit | Attending: Internal Medicine | Admitting: Internal Medicine

## 2014-02-09 DIAGNOSIS — R319 Hematuria, unspecified: Secondary | ICD-10-CM

## 2014-02-09 DIAGNOSIS — R109 Unspecified abdominal pain: Secondary | ICD-10-CM

## 2014-02-09 MED ORDER — IOHEXOL 350 MG/ML SOLN
100.0000 mL | Freq: Once | INTRAVENOUS | Status: AC | PRN
  Administered 2014-02-09: 100 mL via INTRAVENOUS

## 2014-02-11 ENCOUNTER — Encounter: Payer: Self-pay | Admitting: Internal Medicine

## 2014-07-12 ENCOUNTER — Ambulatory Visit (INDEPENDENT_AMBULATORY_CARE_PROVIDER_SITE_OTHER): Payer: BLUE CROSS/BLUE SHIELD | Admitting: Internal Medicine

## 2014-07-12 ENCOUNTER — Encounter: Payer: Self-pay | Admitting: Internal Medicine

## 2014-07-12 VITALS — BP 126/74 | HR 60 | Temp 97.9°F | Resp 16 | Ht 74.5 in | Wt 224.0 lb

## 2014-07-12 DIAGNOSIS — L723 Sebaceous cyst: Secondary | ICD-10-CM | POA: Insufficient documentation

## 2014-07-12 NOTE — Patient Instructions (Signed)
Wound Care Wound care helps prevent pain and infection.  You may need a tetanus shot if:  You cannot remember when you had your last tetanus shot.  You have never had a tetanus shot.  The injury broke your skin. If you need a tetanus shot and you choose not to have one, you may get tetanus. Sickness from tetanus can be serious. HOME CARE   Only take medicine as told by your doctor.  Clean the wound daily with mild soap and water.  Change any bandages (dressings) as told by your doctor.  Put medicated cream and a bandage on the wound as told by your doctor.  Change the bandage if it gets wet, dirty, or starts to smell.  Take showers. Do not take baths, swim, or do anything that puts your wound under water.  Rest and raise (elevate) the wound until the pain and puffiness (swelling) are better.  Keep all doctor visits as told. GET HELP RIGHT AWAY IF:   Yellowish-white fluid (pus) comes from the wound.  Medicine does not lessen your pain.  There is a red streak going away from the wound.  You have a fever. MAKE SURE YOU:   Understand these instructions.  Will watch your condition.  Will get help right away if you are not doing well or get worse. Document Released: 02/04/2008 Document Revised: 07/20/2011 Document Reviewed: 08/31/2010 [facility] Patient Information 2015 [place], [place]. This information is not intended to replace advice given to you by your health care provider. Make sure you discuss any questions you have with your health care provider.

## 2014-07-12 NOTE — Assessment & Plan Note (Signed)
I and D was successful He was given pt ed regarding wound care He will RTC in 7-10 days for suture removal and wound check

## 2014-07-12 NOTE — Progress Notes (Signed)
Pre visit review using our clinic review tool, if applicable. No additional management support is needed unless otherwise documented below in the visit note. ° °

## 2014-07-12 NOTE — Progress Notes (Signed)
° °  Subjective:    Patient ID: Manuel Bauer, male    DOB: 19-May-1965, 49 y.o.   MRN: [id number]  HPI  He is concerned about a cyst/mass over his right mid-back that he feels has grown over the last month.   Review of Systems  Constitutional: Negative.  Negative for fever, chills, diaphoresis, appetite change and fatigue.  HENT: Negative.   Eyes: Negative.   Respiratory: Negative.   Cardiovascular: Negative for chest pain, palpitations and leg swelling.  Gastrointestinal: Negative.   Endocrine: Negative.   Genitourinary: Negative.   Musculoskeletal: Negative.   Allergic/Immunologic: Negative.   Neurological: Negative.   Hematological: Negative.   Psychiatric/Behavioral: Negative.        Objective:   Physical Exam  Skin:     The area was cleaned with betadine then prepped and draped in sterile fashion, local anesthesia was obtained with 2% lido with epi, 1.5 cc were used. Next a 6 mm punch incision was made and the contents of a sebaceous cyst was removed, there was no exudate/mass/FB. The cavity was cleaned with forceps, the wound was closed with 1 simple interrupted suture using 3-0 nylon. There was no blood loss or complications. He tolerated the procedure well with no complications.          Assessment & Plan:

## 2014-07-19 ENCOUNTER — Encounter: Payer: Self-pay | Admitting: Internal Medicine

## 2014-07-19 ENCOUNTER — Ambulatory Visit (INDEPENDENT_AMBULATORY_CARE_PROVIDER_SITE_OTHER): Payer: Self-pay | Admitting: Internal Medicine

## 2014-07-19 VITALS — BP 116/78 | HR 62 | Temp 97.9°F | Resp 16 | Ht 74.5 in | Wt 222.0 lb

## 2014-07-19 DIAGNOSIS — L723 Sebaceous cyst: Secondary | ICD-10-CM

## 2014-07-19 NOTE — Patient Instructions (Signed)
Wound Care Wound care helps prevent pain and infection.  You may need a tetanus shot if:  You cannot remember when you had your last tetanus shot.  You have never had a tetanus shot.  The injury broke your skin. If you need a tetanus shot and you choose not to have one, you may get tetanus. Sickness from tetanus can be serious. HOME CARE   Only take medicine as told by your doctor.  Clean the wound daily with mild soap and water.  Change any bandages (dressings) as told by your doctor.  Put medicated cream and a bandage on the wound as told by your doctor.  Change the bandage if it gets wet, dirty, or starts to smell.  Take showers. Do not take baths, swim, or do anything that puts your wound under water.  Rest and raise (elevate) the wound until the pain and puffiness (swelling) are better.  Keep all doctor visits as told. GET HELP RIGHT AWAY IF:   Yellowish-white fluid (pus) comes from the wound.  Medicine does not lessen your pain.  There is a red streak going away from the wound.  You have a fever. MAKE SURE YOU:   Understand these instructions.  Will watch your condition.  Will get help right away if you are not doing well or get worse. Document Released: 02/04/2008 Document Revised: 07/20/2011 Document Reviewed: 08/31/2010 [facility]Exit[facility] Patient Information 2015 [place]Care, [place]. This information is not intended to replace advice given to you by your health care provider. Make sure you discuss any questions you have with your health care provider.

## 2014-07-19 NOTE — Progress Notes (Signed)
Pre visit review using our clinic review tool, if applicable. No additional management support is needed unless otherwise documented below in the visit note. ° °

## 2014-07-19 NOTE — Assessment & Plan Note (Signed)
Suture removed The wound will need more time to heal but there is no evidence of infection at this time He was given pt ed material about would care

## 2014-07-19 NOTE — Progress Notes (Signed)
° °  Subjective:    Patient ID: Manuel Bauer, male    DOB: Dec 07, 1965, 49 y.o.   MRN: [id number]  Wound Check He was originally treated 5 to 10 days ago. Previous treatment included laceration repair. His temperature was unmeasured prior to arrival. There has been no drainage from the wound. There is no redness present. There is no swelling present. The pain has no pain.      Review of Systems  All other systems reviewed and are negative.      Objective:   Physical Exam  Pulmonary/Chest:            Assessment & Plan:

## 2015-06-04 IMAGING — CR DG THORACIC SPINE 3V
3 series · 3 of 3 positions shown · non-contrast
Comparison: None.

CLINICAL DATA: Six week history of mid back pain.  No known injury.

EXAM:
THORACIC SPINE - 2 VIEW + SWIMMERS

[view not recorded (1 of 3)]
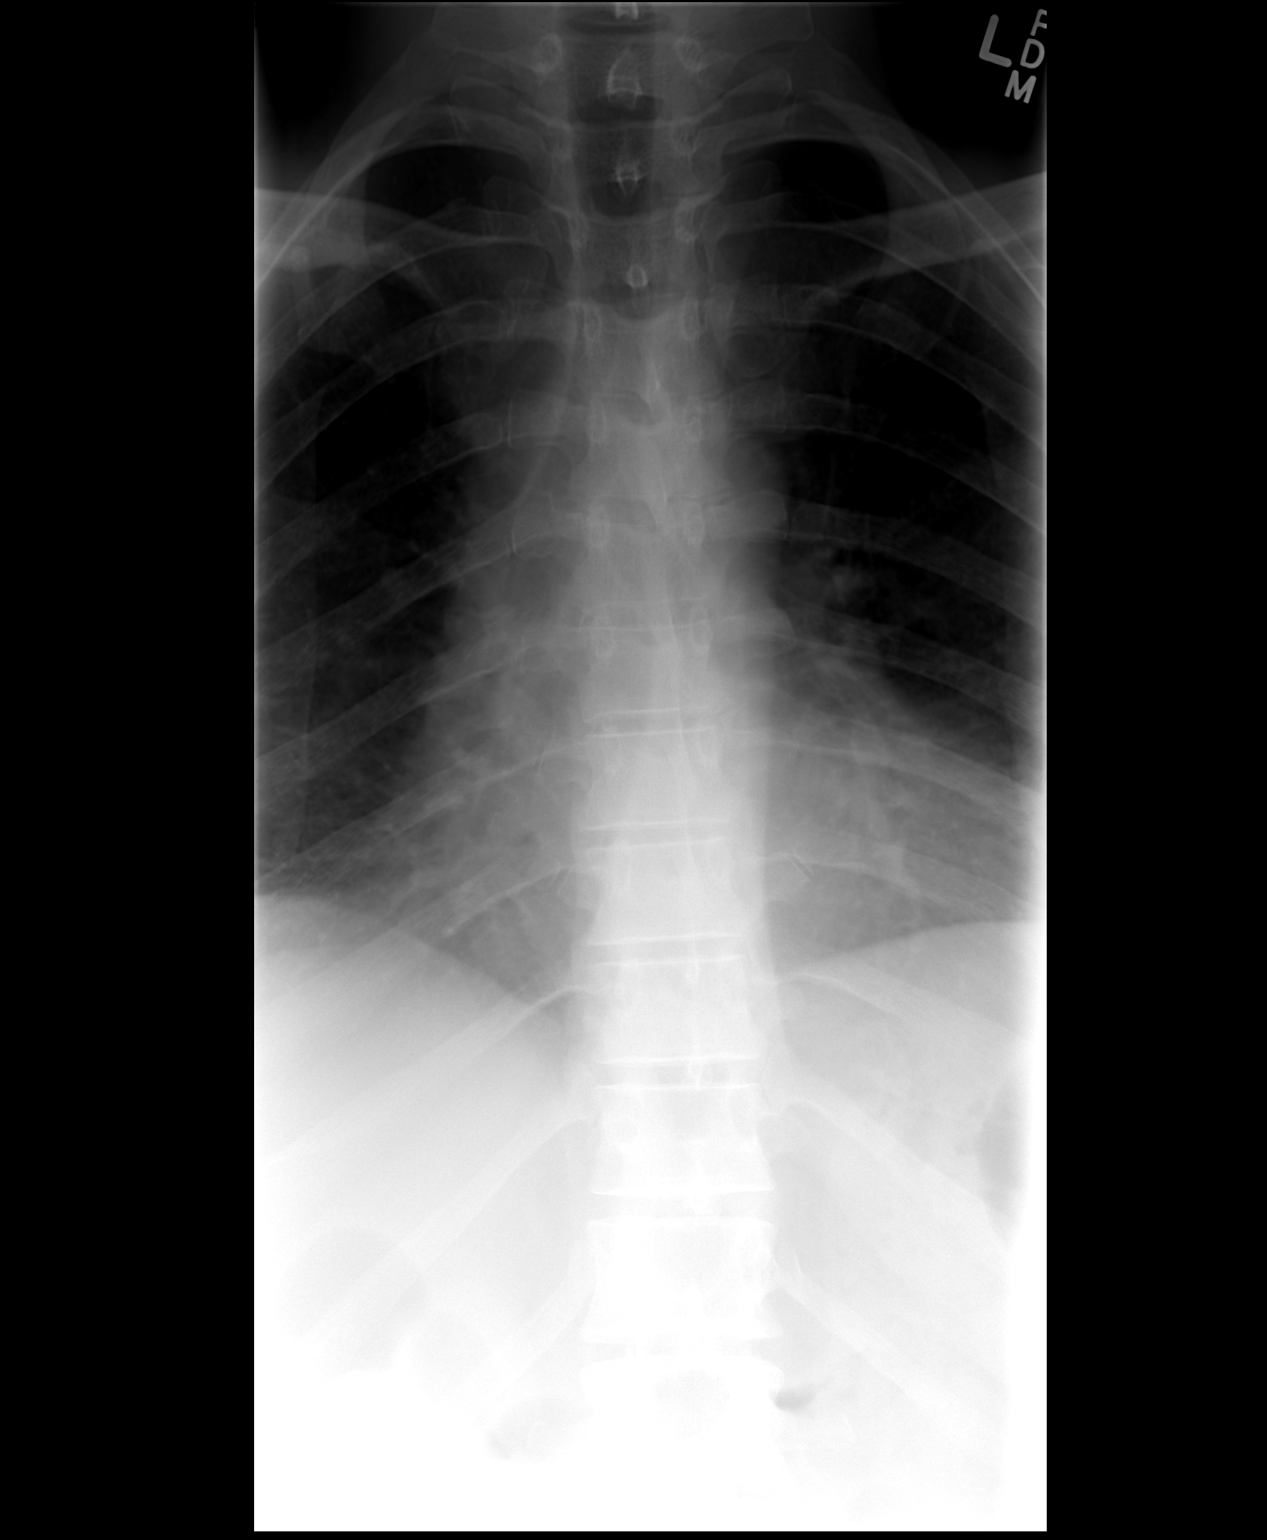

[view not recorded (2 of 3)]
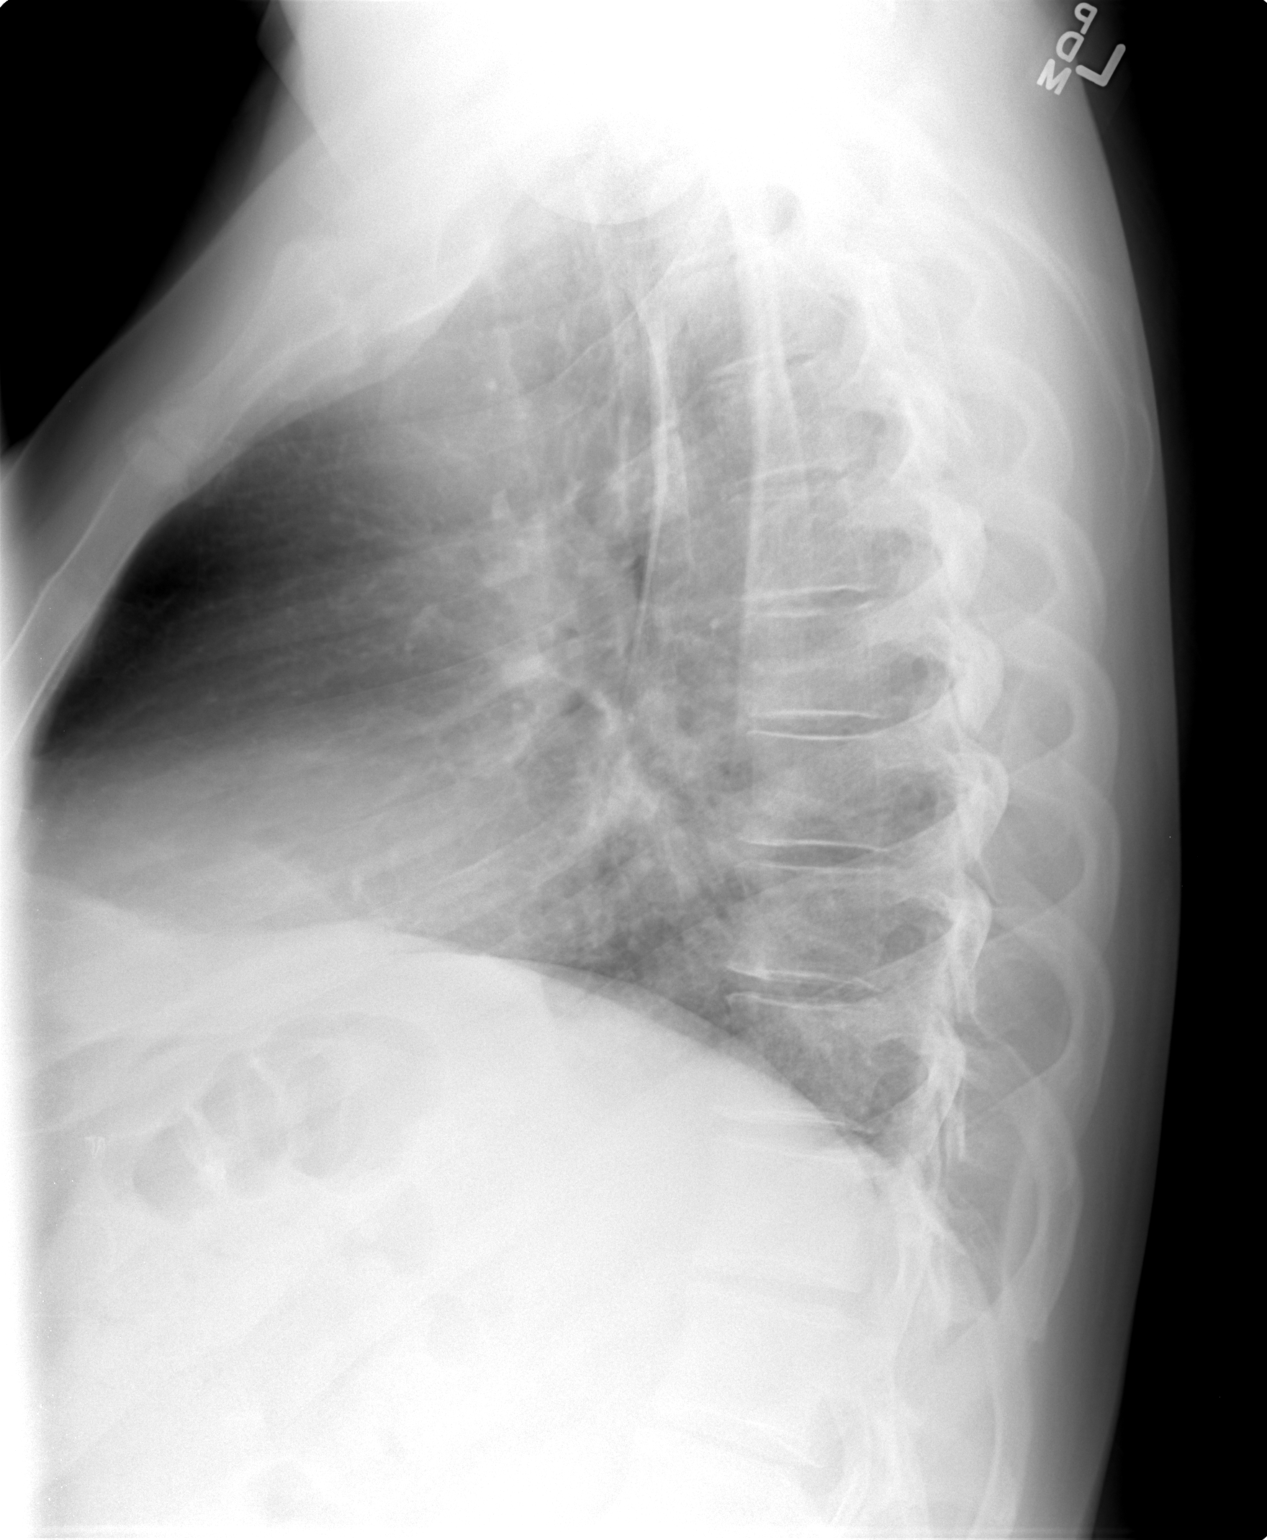

[view not recorded (3 of 3)]
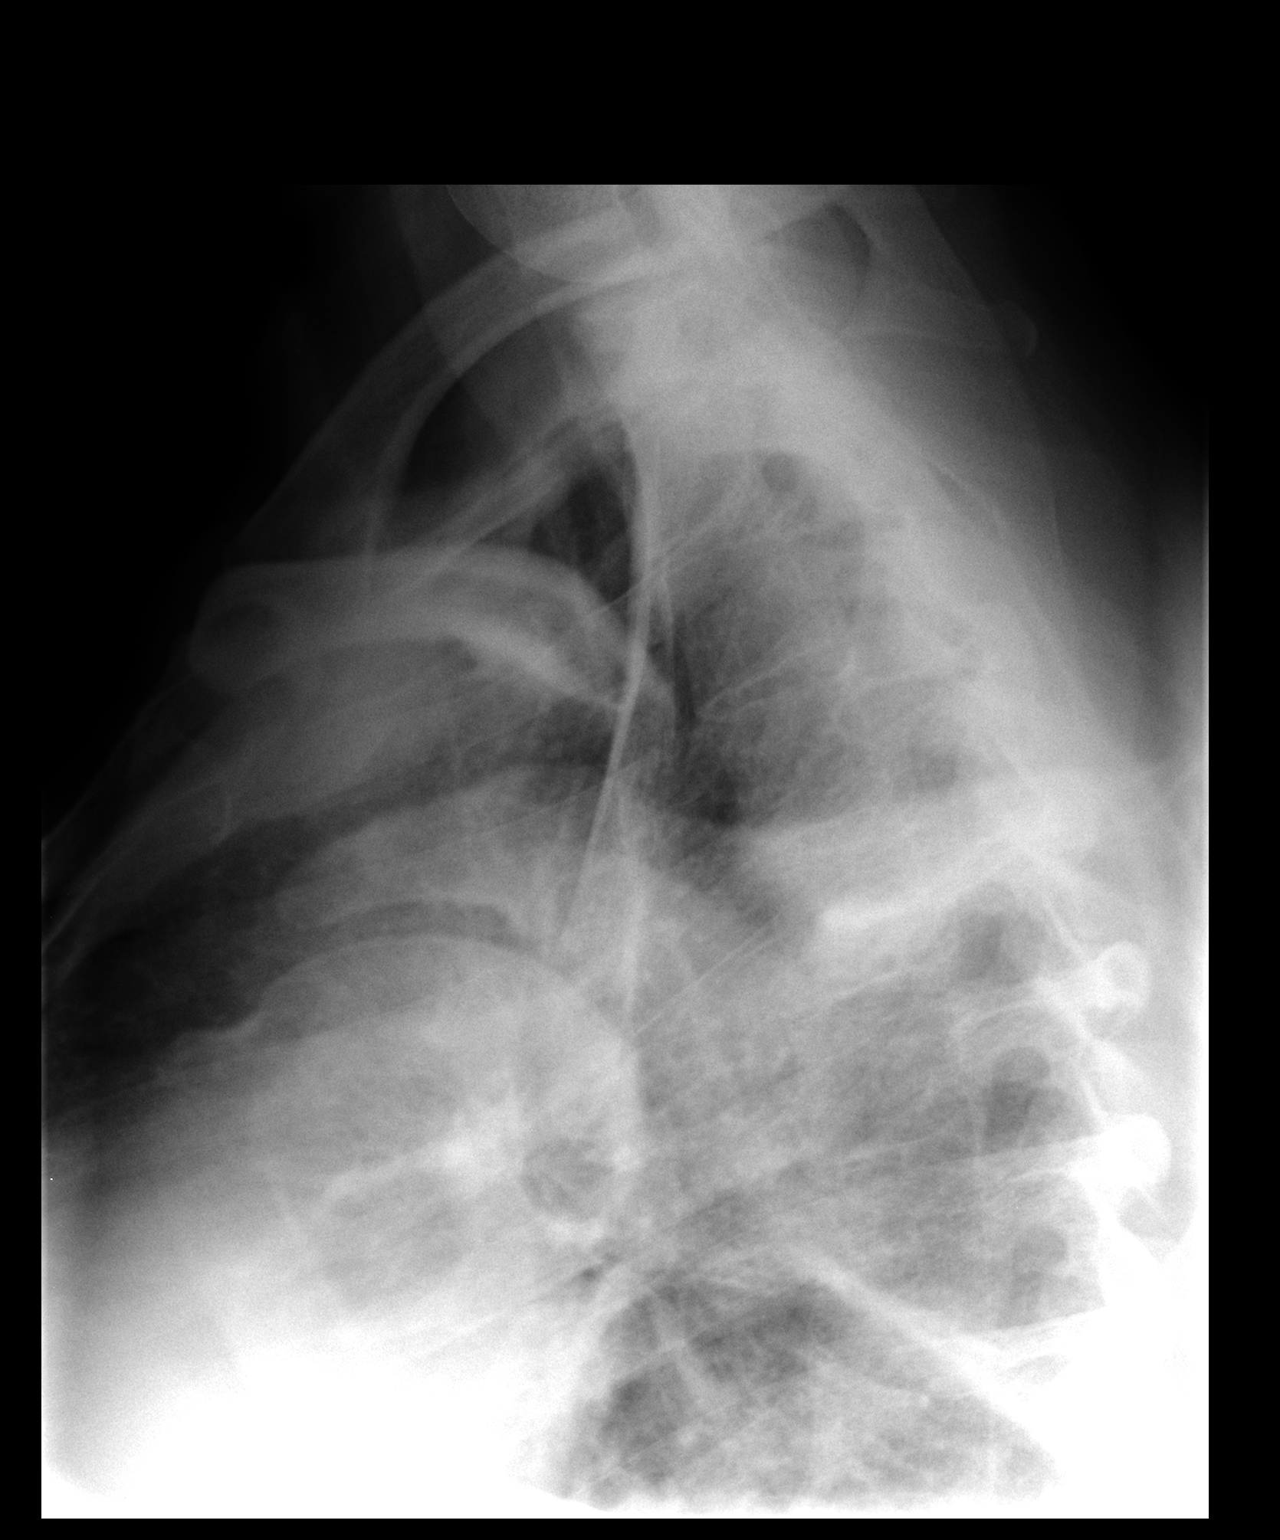

[3 of 3 positions shown; findings below may reference images not displayed]

FINDINGS: Twelve rib-bearing thoracic vertebrae with anatomic alignment. No
fractures. Well-preserved disk spaces. No significant spondylosis.
Intact pedicles.
IMPRESSION: Normal examination.

## 2015-07-13 IMAGING — CT CT ABD-PEL WO/W CM
2 of 10 series · 12 of 46 positions shown, 18 images · IV contrast (omnipaque)
Comparison: None.

CLINICAL DATA: Initial encounter for hematuria.

EXAM:
CT ABDOMEN AND PELVIS WITHOUT AND WITH CONTRAST
TECHNIQUE: Multidetector CT imaging of the abdomen and pelvis was performed
following the standard protocol before and following the bolus
administration of intravenous contrast.
CONTRAST:  100mL OMNIPAQUE IOHEXOL 350 MG/ML SOLN

[Series 4: renal nephrographic · axial · 0.76mm/px · z∈[-514,-94]mm · 10 of 169 slices shown, 15 images]
[im 15/169  soft-tissue]
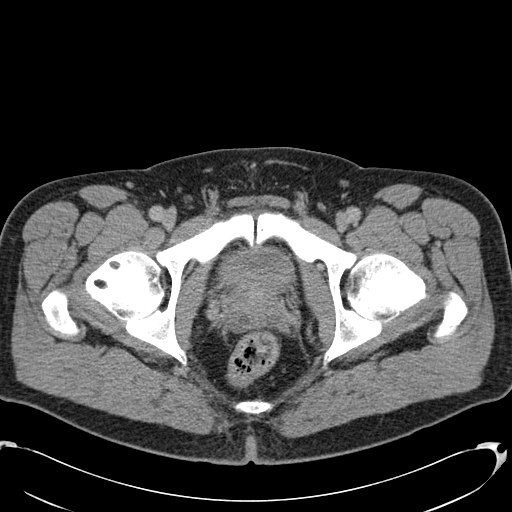
[im 15/169  bone]
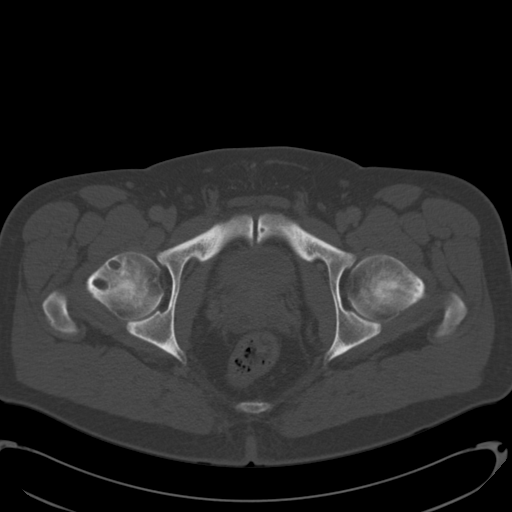
[im 29/169  soft-tissue]
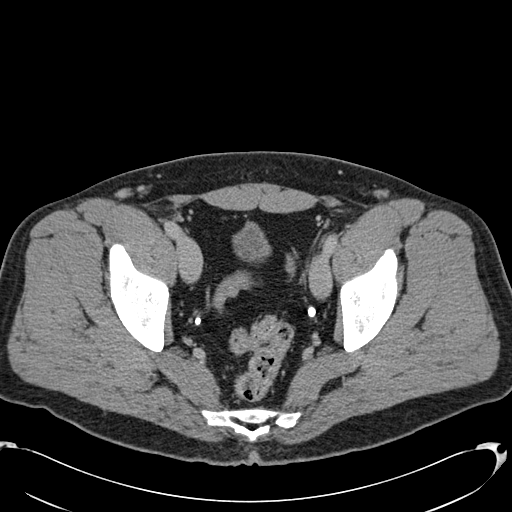
[im 57/169  soft-tissue]
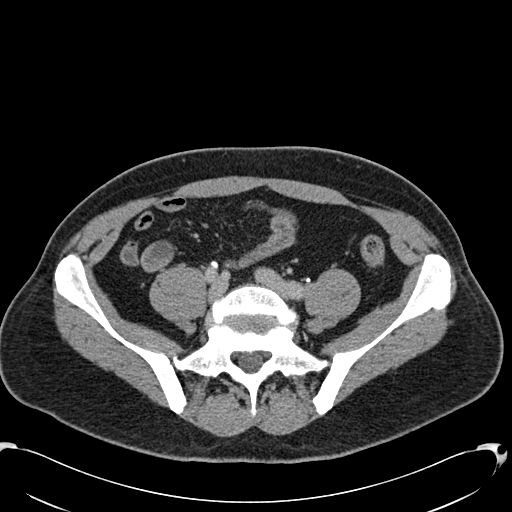
[im 71/169  soft-tissue]
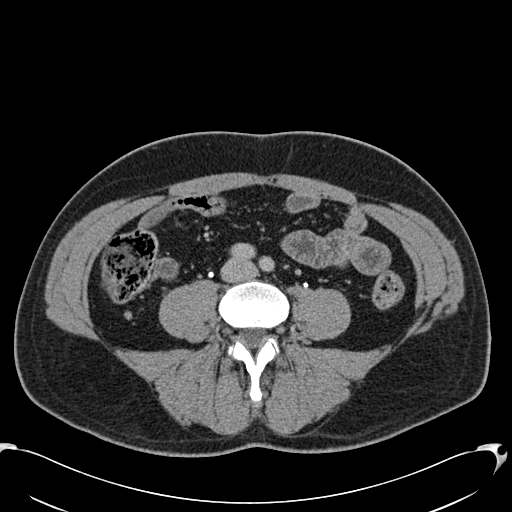
[im 85/169  soft-tissue]
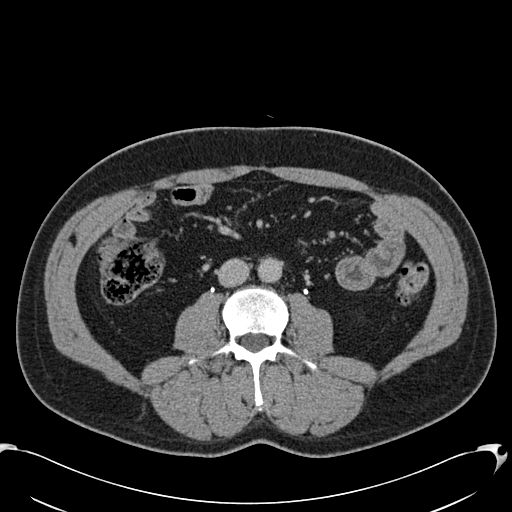
[im 99/169  soft-tissue]
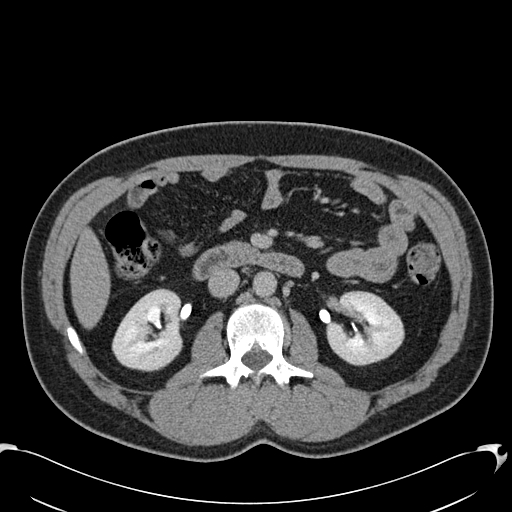
[im 113/169  soft-tissue]
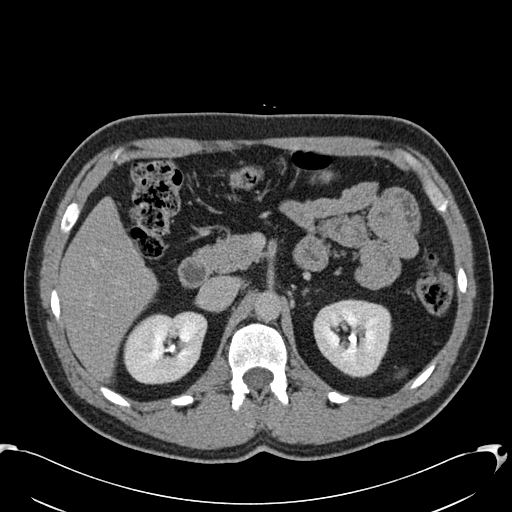
[im 113/169  lung]
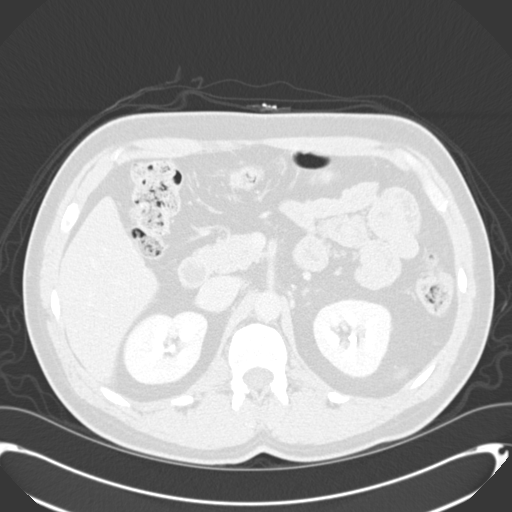
[im 127/169  lung]
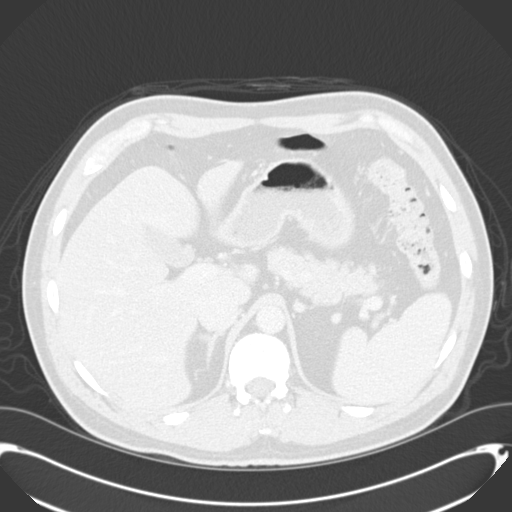
[im 141/169  soft-tissue]
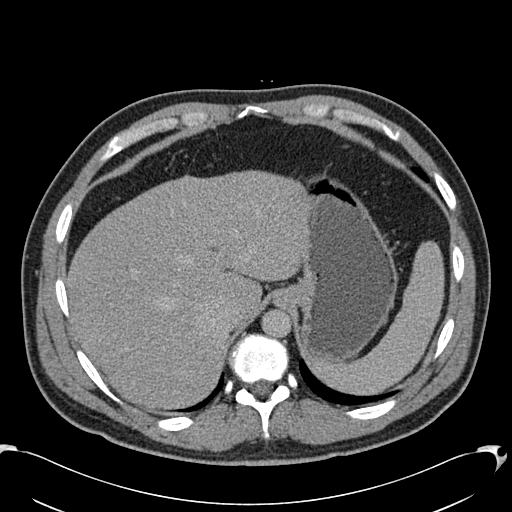
[im 141/169  lung]
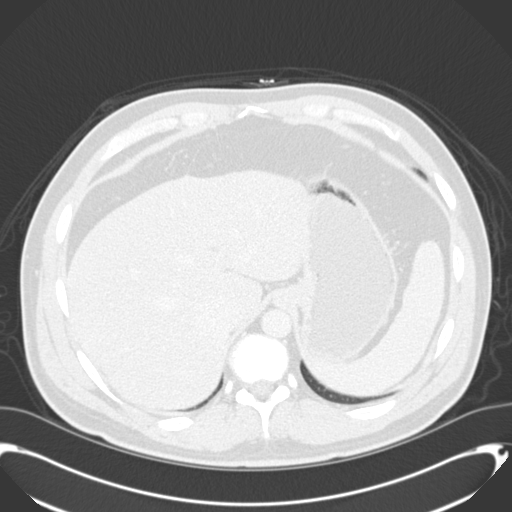
[im 155/169  soft-tissue]
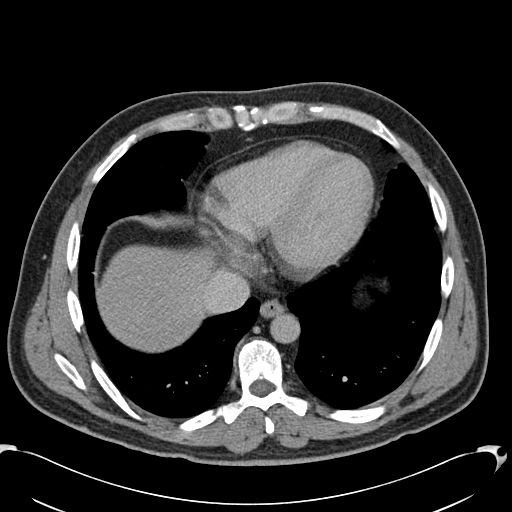
[im 155/169  lung]
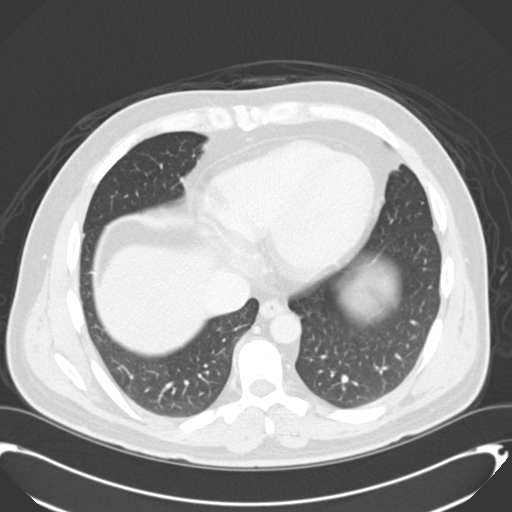
[im 155/169  bone]
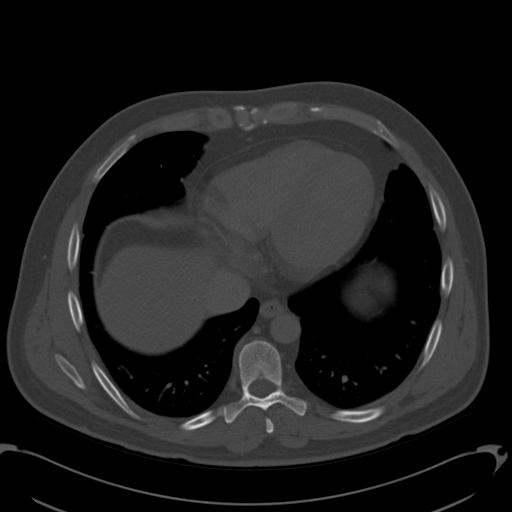

[Series 602: cor · coronal · 1.00mm/px · 2 of 128 slices shown, 3 images]
[im 43/128  soft-tissue]
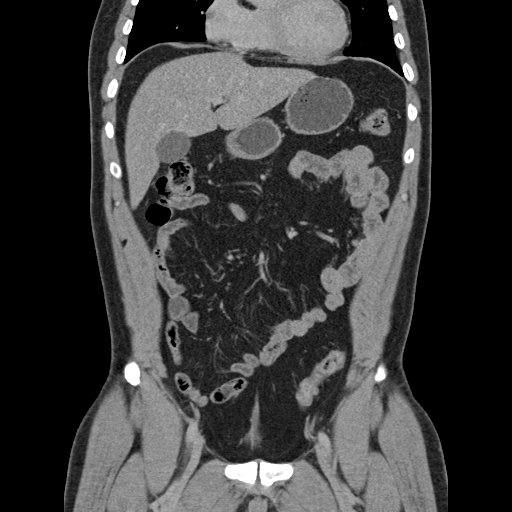
[im 43/128  bone]
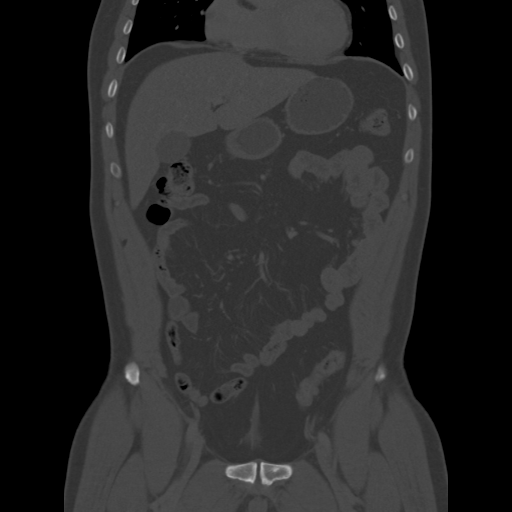
[im 85/128  soft-tissue]
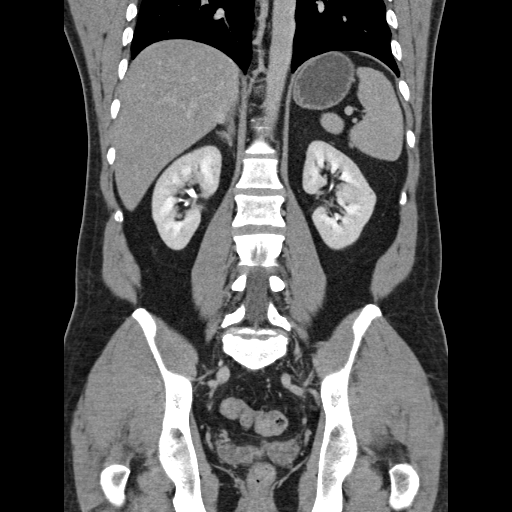

[12 of 46 positions shown; findings below may reference images not displayed]

FINDINGS: Lower chest: No evidence for pulmonary nodule or mass. No focal
consolidation. No evidence for pleural effusion.

Hepatobiliary: No focal abnormality within the liver parenchyma.
There is no evidence for gallstones, gallbladder wall thickening, or
pericholecystic fluid. No intrahepatic or extrahepatic biliary
dilation.

Pancreas: No focal mass lesion. No dilatation of the main duct. No
intraparenchymal cyst. No peripancreatic edema.

Spleen: No splenomegaly. No focal mass lesion.

Adrenals/Urinary Tract: No adrenal nodule or mass. Pre contrast
imaging shows no stones in either kidney. No proximal ureteral
stones. No pre contrast imaging was performed through the pelvis so
assessment for distal ureteral and bladder stones is limited. On
delayed imaging, both ureters are well opacified and show no focal
ureteral dilatation, focal wall thickening, or intraluminal filling
defect. No gross bladder wall abnormality is evident.

Stomach/Bowel: Stomach is nondistended. No gastric wall thickening.
No evidence of outlet obstruction. Duodenum is normally positioned
as is the ligament of Treitz. No small bowel wall thickening. No
small bowel dilatation. Terminal ileum is normal. Appendicolith
noted in the normal appendix. No gross colonic mass. No colonic wall
thickening. No substantial diverticular change.

Vascular/Lymphatic: No abdominal aortic aneurysm. No gastrohepatic
or hepatoduodenal ligament lymphadenopathy. No retroperitoneal
lymphadenopathy. No evidence for pelvic sidewall lymphadenopathy.

Reproductive: Prostate gland is unremarkable.

Other: No intraperitoneal free fluid.  No abdominal wall hernia.

Musculoskeletal: Bone windows reveal no worrisome lytic or sclerotic
osseous lesions. Bilateral pars interarticularis defects are seen at
L5 with grade 1 anterolisthesis of L5 on S1.
IMPRESSION: No CT findings to explain the patient's history of hematuria.

Bilateral pars interarticularis defects at L5.

## 2017-07-23 ENCOUNTER — Ambulatory Visit (INDEPENDENT_AMBULATORY_CARE_PROVIDER_SITE_OTHER): Payer: BC Managed Care – HMO | Admitting: Foot & Ankle Surgery

## 2017-07-23 ENCOUNTER — Encounter (INDEPENDENT_AMBULATORY_CARE_PROVIDER_SITE_OTHER): Payer: Self-pay | Admitting: Foot & Ankle Surgery

## 2017-07-23 VITALS — BP 129/78 | HR 61 | Resp 15 | Ht 75.0 in | Wt 220.0 lb

## 2017-07-23 DIAGNOSIS — S93492A Sprain of other ligament of left ankle, initial encounter: Secondary | ICD-10-CM

## 2017-07-23 DIAGNOSIS — M25372 Other instability, left ankle: Secondary | ICD-10-CM

## 2017-07-23 NOTE — Progress Notes (Signed)
PODIATRIC SURGERY   PROGRESS NOTE      Patient Name: Albert Ryan,Albert Ryan    Assessment:   -Patient is 52 y.o. male with History of chronic left ankle instability with history of frequent left ankle sprains    History 2 left ankle sprains over the course of the last 2-3 months      Plan:   -Referral to physical therapy provided  -Discussed benefits of PT functional rehabilitation including increasing stability, proprioception, and decreased frequency of injury.  -Recommend continued use of ankle brace-patient has lace up ankle brace at home.  No return to running until he is been seen by PT and his pain is resolved.  -Rest, ice, elevation and compression  -We discussed x-rays today, were both in agreement that with low suspicion for fracture.  He had x-rays done 2 months ago which were negative for any notable pathology  -F/U 2 months.  If no significant improvement-would recommend MRI imaging        Subjective:   Patient presents to clinic With chief complaint of chronic left ankle injuries.  Patient reports that he is a "ankle sprainer "he had a bad ankle sprain in January of this year.  He was seen by an orthopedic surgeon who took an x-ray normal.  He likes to play soccer.  He recently was playing soccer and re-sprained his left ankle.  It significant swelling and some posterior heel pain and Achilles pain.  This injury is one week ago.  He has a boot at home.  He wore the boot for a few days after the injury, which helped.  He was wearing An ankle brace at the time of his most recent sprain.  He typically wears an ankle brace when active.  Does not have significant pain now.  He is no longer wearing the boot.  He like treatment advice regarding decreasing amount of ankle sprains and injuries.  He has.  He feels like he is aging and keeps having or frequent injuries.  He like to be able to stay active. denies any current swelling or significant pain.    Denies N/V/F/C/SOB/CP  All other systems were reviewed and are  negative    Past Medical History: History reviewed. No pertinent past medical history.    The following portions of the patient's history were reviewed and updated as appropriate: allergies, current medications, past medical history, past surgical history, family history, and problem list.  Physical Exam:     Vitals:    07/23/17 0757   BP: 129/78   Pulse: 61   Resp: 15       Gen: AAOx3, NAD  Vascular:  Palpable pedal pulses b/l, CFT to all toes <3 sec. .  Mild trace edema to the left lateral ankle.. No varicosities  Derm:  Skin intact without wounds or rashes.  No erythema or ecchymosis.  MSK:  No specific area pinpoint tenderness appreciated on exam.  Lateral ankle ligaments are nontender.  Deltoid ligaments.  Nontender.  Distal fibula and medial malleolus.  Nontender.  Syndesmosis is nontender.  No gross overall laxity noted to the ankle.  Negative anterior drawer test.  MMS 5/5 and symmetric, No areas of pain or tenderness. No gross deformity. ROM normal without crepitus.  Neuro:  SILT. Neg tinel's sign.  Able to wiggle toes. No motor deficits.       Signed by: Laqueta Carina, DPM          This note was dictated using voice recognition speech  to text software in may contain inadvertent recognition typos or errors.

## 2017-10-20 ENCOUNTER — Encounter (INDEPENDENT_AMBULATORY_CARE_PROVIDER_SITE_OTHER): Payer: Self-pay | Admitting: Foot & Ankle Surgery

## 2021-07-11 ENCOUNTER — Other Ambulatory Visit: Payer: Self-pay | Admitting: Physician Assistant

## 2024-05-01 LAB — LIPID PANEL
Cholesterol: 180 (ref 0–200)
HDL: 45 (ref 35–70)
LDL Cholesterol: 112
LDl/HDL Ratio: 2.5
Triglycerides: 109 (ref 40–160)

## 2024-05-01 LAB — HEPATIC FUNCTION PANEL
ALT: 28 U/L (ref 10–40)
AST: 29 (ref 14–40)
Alkaline Phosphatase: 71 (ref 25–125)
Bilirubin, Total: 0.6

## 2024-05-01 LAB — BASIC METABOLIC PANEL WITH GFR
BUN: 15 (ref 4–21)
Creatinine: 1 (ref 0.6–1.3)
Glucose: 97

## 2024-05-01 LAB — HIV ANTIBODY (ROUTINE TESTING W REFLEX): HIV 1&2 Ab, 4th Generation: NEGATIVE

## 2024-05-01 LAB — COMPREHENSIVE METABOLIC PANEL WITH GFR: Albumin: 4.3 (ref 3.5–5.0)

## 2024-05-01 LAB — PSA: PSA: 16.5

## 2024-05-01 LAB — HEMOGLOBIN A1C: Hemoglobin A1C: 5.5

## 2024-05-30 ENCOUNTER — Ambulatory Visit: Payer: Self-pay | Admitting: Family Medicine

## 2024-06-07 ENCOUNTER — Encounter: Payer: Self-pay | Admitting: Family Medicine

## 2024-06-07 ENCOUNTER — Ambulatory Visit (INDEPENDENT_AMBULATORY_CARE_PROVIDER_SITE_OTHER): Payer: Self-pay | Admitting: Family Medicine

## 2024-06-07 VITALS — BP 136/81 | HR 66 | Ht 74.5 in | Wt 230.0 lb

## 2024-06-07 DIAGNOSIS — R972 Elevated prostate specific antigen [PSA]: Secondary | ICD-10-CM | POA: Diagnosis not present

## 2024-06-07 DIAGNOSIS — Z86006 Personal history of melanoma in-situ: Secondary | ICD-10-CM | POA: Diagnosis not present

## 2024-06-07 DIAGNOSIS — N401 Enlarged prostate with lower urinary tract symptoms: Secondary | ICD-10-CM | POA: Diagnosis not present

## 2024-06-07 DIAGNOSIS — Z7689 Persons encountering health services in other specified circumstances: Secondary | ICD-10-CM | POA: Diagnosis not present

## 2024-06-07 NOTE — Patient Instructions (Signed)
 It was nice to see you today,  We addressed the following topics today: - I will send in your referrals to dermatology and urology.  If you do not hear from someone in 2 weeks let us  know.  - if you decide you want labs or can't find your old labs just schedule an appointment with us .  Otherwise bring in your old results so we can copy and add them to your chart.   Have a great day,  Rolan Slain, MD

## 2024-06-07 NOTE — Assessment & Plan Note (Signed)
 Chronic BPH with elevated PSA, previous negative biopsy, symptoms managed with alfuzosin.   Has had biopsy and prostate mri in [place] .   - Referred to urology for evaluation and management.

## 2024-06-07 NOTE — Progress Notes (Unsigned)
 "   Subjective   Patient ID: Manuel Bauer, male    DOB: 1966-04-17  Age: 59 y.o. MRN: [id number]  Chief Complaint  Patient presents with   New Patient (Initial Visit)    Discussed the use of AI scribe software for clinical note transcription with the patient, who gave verbal consent to proceed.  History of Present Illness   Manuel Bauer is a 59 year old male who presents for follow-up of elevated PSA levels.  He has longstanding elevated PSA that began without urinary symptoms. His PSA has ranged from 8 to 15. A prior prostate biopsy was negative but was complicated by severe bleeding and clot formation that required an emergency room visit, which makes him reluctant to repeat biopsy. His PSA remains elevated, though his free PSA percentage is described as reassuring, and he feels his baseline PSA is around 10.  He has obstructive urinary symptoms of inability to urinate, low and weak stream that are adequately controlled with alfuzosin. Without alfuzosin he is unable to void, so he takes it consistently and currently has a full 90-tablet supply.  His past medical history includes melanoma excised from his back about 2.5 to 3 years ago. He reports no hypertension, hyperlipidemia, or diabetes, with recent blood work for a life insurance exam showing normal glucose and A1c.  Family history is limited due to adoption. From his biological relatives, his mother has ADHD, anxiety, and multiple sclerosis. His biological father had prediabetes and metabolic syndrome. A half-sister has Crohns disease.  He is married with two adult children. He does not use tobacco and drinks alcohol infrequently, about once every two weeks. 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 He is intermittently active with swimming and splitting wood. He has not yet established care with a dentist or eye doctor since moving back to the area.          The ASCVD Risk score (Arnett  DK, et al., 2019) failed to calculate for the following reasons:   Cannot find a previous HDL lab  Health Maintenance Due  Topic Date Due   COVID-19 Vaccine (1) Never done   HIV Screening  Never done   Hepatitis C Screening  Never done   Hepatitis B Vaccines 19-59 Average Risk (1 of 3 - 19+ 3-dose series) Never done   Colonoscopy  01/11/2020      Objective:     BP 136/81   Pulse 66   Ht 6' 2.5 (1.892 m)   Wt 230 lb (104.3 kg)   SpO2 98%   BMI 29.14 kg/m  {Vitals History (Optional):23777}  Physical Exam   Gen: alert, oriented HEENT: perrla, eomi, mmm CV: rrr, no murmur Pulm: lctab. No wheeze or crackles.  GI: soft, nbs.  Nontender to palpation MSK: strength equal b/l. Normal gait Ext: no pedal edema Skin: warm and dry, no rashes Psych: pleasant affect.  Spontaneous speech       No results found for any visits on 06/07/24.      Assessment & Plan:   Encounter to establish care  Elevated PSA -     Ambulatory referral to Urology  History of melanoma in situ Assessment & Plan: Malignant melanoma excised 2.5 to 3 years ago, no current issues. - Referred to dermatologist for skin evaluation and monitoring.  Orders: -     Ambulatory referral to Dermatology  Benign localized prostatic hyperplasia with lower urinary tract symptoms (LUTS) Assessment & Plan: Chronic  BPH with elevated PSA, previous negative biopsy, symptoms managed with alfuzosin.   Has had biopsy and prostate mri in [place] .   - Referred to urology for evaluation and management.         Return in about 1 year (around 06/07/2025) for physical.    Toribio MARLA Slain, MD   "

## 2024-06-07 NOTE — Assessment & Plan Note (Signed)
 Malignant melanoma excised 2.5 to 3 years ago, no current issues. - Referred to dermatologist for skin evaluation and monitoring.

## 2024-06-15 ENCOUNTER — Ambulatory Visit: Admitting: Dermatology

## 2024-06-15 ENCOUNTER — Encounter: Payer: Self-pay | Admitting: Dermatology

## 2024-06-15 VITALS — BP 133/79 | HR 68

## 2024-06-15 DIAGNOSIS — L821 Other seborrheic keratosis: Secondary | ICD-10-CM

## 2024-06-15 DIAGNOSIS — Z86006 Personal history of melanoma in-situ: Secondary | ICD-10-CM

## 2024-06-15 DIAGNOSIS — D229 Melanocytic nevi, unspecified: Secondary | ICD-10-CM

## 2024-06-15 DIAGNOSIS — L578 Other skin changes due to chronic exposure to nonionizing radiation: Secondary | ICD-10-CM

## 2024-06-15 DIAGNOSIS — D2372 Other benign neoplasm of skin of left lower limb, including hip: Secondary | ICD-10-CM

## 2024-06-15 DIAGNOSIS — D1801 Hemangioma of skin and subcutaneous tissue: Secondary | ICD-10-CM

## 2024-06-15 DIAGNOSIS — L814 Other melanin hyperpigmentation: Secondary | ICD-10-CM

## 2024-06-15 DIAGNOSIS — Z1283 Encounter for screening for malignant neoplasm of skin: Secondary | ICD-10-CM

## 2024-06-15 DIAGNOSIS — W908XXA Exposure to other nonionizing radiation, initial encounter: Secondary | ICD-10-CM

## 2024-06-15 DIAGNOSIS — D239 Other benign neoplasm of skin, unspecified: Secondary | ICD-10-CM

## 2024-06-15 NOTE — Progress Notes (Signed)
 "  New Patient Visit   History of Present Illness Manuel Bauer is a 59 year old male with a history of melanoma in situ who presents for a TBSE.  He has a history of melanoma in situ on his back, which was excised. He has been undergoing dermatologic follow-ups every six months, with the last visit in December. He moved ten months ago, which has affected his follow-up schedule.  He has no current concerns about specific spots but mentions some itchy areas on his lower back, and wonders if it could be due to dryness or chlorine exposure from swimming. He has a history of sebaceous cysts that can be irritating and itchy. He does not use a moisturizer regularly.  He mentions a spot on his calf that has been present for most of his life and occasionally gets itchy. He also describes a blackhead or sebaceous cyst on his back that his wife has been unable to extract.  His family history is complicated; he is unaware of any melanoma history on his father's side and has limited information about his mother's side.  Socially, he moved from the [place] area to his current location due to job changes and downsizing. He previously lived in [place] and enjoyed the area.  Patient present today for new patient visit for TBSE. Patient has previously been treated by a dermatologist.Patient reports he  has hx of bx. Patient denies family history of skin cancers. Patient reports throughout his lifetime has had moderate sun exposure. Currently, patient reports if he  has excessive sun exposure, he  does not apply sunscreen and/or wears protective coverings.  The following portions of the chart were reviewed this encounter and updated as appropriate: medications, allergies, medical history  Review of Systems:  No other skin or systemic complaints except as noted in HPI or Assessment and Plan.  Objective  Well appearing patient in no apparent distress; mood and affect are within normal limits.  A full examination  was performed including scalp, head, eyes, ears, nose, lips, neck, chest, axillae, abdomen, back, buttocks, bilateral upper extremities, bilateral lower extremities, hands, feet, fingers, toes, fingernails, and toenails. All findings within normal limits unless otherwise noted below.   Relevant exam findings are noted in the Assessment and Plan.    Assessment & Plan   LENTIGINES, SEBORRHEIC KERATOSES, HEMANGIOMAS - Benign normal skin lesions - Benign-appearing - Call for any changes  MELANOCYTIC NEVI - Tan-brown and/or pink-flesh-colored symmetric macules and papules - Benign appearing on exam today - Observation - Call clinic for new or changing moles - Recommend daily use of broad spectrum spf 30+ sunscreen to sun-exposed areas.   ACTINIC DAMAGE - Chronic condition, secondary to cumulative UV/sun exposure - diffuse scaly erythematous macules with underlying dyspigmentation - Recommend daily broad spectrum sunscreen SPF 30+ to sun-exposed areas, reapply every 2 hours as needed.  - Staying in the shade or wearing long sleeves, sun glasses (UVA+UVB protection) and wide brim hats (4-inch brim around the entire circumference of the hat) are also recommended for sun protection.  - Call for new or changing lesions.  DERMATOFIBROMA - Left posterior leg Exam: Firm pink/brown papulenodule with dimple sign.  Treatment Plan: A dermatofibroma is a benign growth possibly related to trauma, such as an insect bite, cut from shaving, or inflamed acne-type bump.  Treatment options to remove include shave or excision with resulting scar and risk of recurrence.  Since benign-appearing and not bothersome, will observe for now.    SKIN CANCER SCREENING  PERFORMED TODAY  HISTORY OF MELANOMA - Back  - No evidence of recurrence today - No lymphadenopathy - Recommend regular full body skin exams - Recommend daily broad spectrum sunscreen SPF 30+ to sun-exposed areas, reapply every 2 hours as needed.  -  Call if any new or changing lesions are noted between office visits    Return in about 6 months (around 12/13/2024) for 4-6 months TBSE follow up.  We spent 45 min reviewing records, taking the patient history, providing face to face care with the patient, sending prescriptions.   I, Doyce Pan, CMA, am acting as scribe for Manuel CHRISTELLA HOLY, MD.  Documentation: I have reviewed the above documentation for accuracy and completeness, and I agree with the above.  Manuel CHRISTELLA HOLY, MD   "

## 2024-06-15 NOTE — Patient Instructions (Signed)

## 2024-12-27 ENCOUNTER — Ambulatory Visit: Admitting: Dermatology

## 2025-06-01 ENCOUNTER — Other Ambulatory Visit

## 2025-06-08 ENCOUNTER — Encounter: Admitting: Family Medicine
